# Patient Record
Sex: Female | Born: 1970
Health system: Southern US, Community
[De-identification: ages and names within clinical notes are randomized; demographics above are authoritative.]

## PROBLEM LIST (undated history)

## (undated) DIAGNOSIS — N83209 Unspecified ovarian cyst, unspecified side: Secondary | ICD-10-CM

## (undated) DIAGNOSIS — J45909 Unspecified asthma, uncomplicated: Secondary | ICD-10-CM

## (undated) DIAGNOSIS — Z8669 Personal history of other diseases of the nervous system and sense organs: Secondary | ICD-10-CM

## (undated) DIAGNOSIS — N6459 Other signs and symptoms in breast: Secondary | ICD-10-CM

## (undated) DIAGNOSIS — K5900 Constipation, unspecified: Secondary | ICD-10-CM

## (undated) DIAGNOSIS — IMO0002 Reserved for concepts with insufficient information to code with codable children: Secondary | ICD-10-CM

## (undated) DIAGNOSIS — N809 Endometriosis, unspecified: Secondary | ICD-10-CM

## (undated) DIAGNOSIS — F419 Anxiety disorder, unspecified: Secondary | ICD-10-CM

## (undated) DIAGNOSIS — M810 Age-related osteoporosis without current pathological fracture: Secondary | ICD-10-CM

## (undated) DIAGNOSIS — F429 Obsessive-compulsive disorder, unspecified: Secondary | ICD-10-CM

## (undated) DIAGNOSIS — T7840XA Allergy, unspecified, initial encounter: Secondary | ICD-10-CM

## (undated) HISTORY — DX: Obsessive-compulsive disorder, unspecified: F42.9

## (undated) HISTORY — PX: APPENDECTOMY: SHX54

## (undated) HISTORY — DX: Anxiety disorder, unspecified: F41.9

## (undated) HISTORY — DX: Endometriosis, unspecified: N80.9

## (undated) HISTORY — PX: ABDOMINAL HYSTERECTOMY: SHX81

## (undated) HISTORY — DX: Constipation, unspecified: K59.00

## (undated) HISTORY — PX: LEFT OOPHORECTOMY: SHX1961

## (undated) HISTORY — DX: Allergy, unspecified, initial encounter: T78.40XA

## (undated) HISTORY — PX: BREAST EXCISIONAL BIOPSY: SUR124

## (undated) HISTORY — PX: COSMETIC SURGERY: SHX468

## (undated) HISTORY — PX: TONSILLECTOMY: SUR1361

## (undated) HISTORY — DX: Unspecified ovarian cyst, unspecified side: N83.209

## (undated) HISTORY — PX: RIGHT OOPHORECTOMY: SHX2359

## (undated) HISTORY — DX: Personal history of other diseases of the nervous system and sense organs: Z86.69

## (undated) HISTORY — PX: CHOLECYSTECTOMY: SHX55

---

## 2008-03-21 ENCOUNTER — Emergency Department (HOSPITAL_BASED_OUTPATIENT_CLINIC_OR_DEPARTMENT_OTHER): Admission: EM | Admit: 2008-03-21 | Discharge: 2008-03-21 | Payer: Self-pay | Admitting: Emergency Medicine

## 2008-08-13 ENCOUNTER — Emergency Department (HOSPITAL_BASED_OUTPATIENT_CLINIC_OR_DEPARTMENT_OTHER): Admission: EM | Admit: 2008-08-13 | Discharge: 2008-08-13 | Payer: Self-pay | Admitting: Emergency Medicine

## 2008-08-13 ENCOUNTER — Ambulatory Visit: Payer: Self-pay | Admitting: Diagnostic Radiology

## 2009-02-03 ENCOUNTER — Emergency Department (HOSPITAL_BASED_OUTPATIENT_CLINIC_OR_DEPARTMENT_OTHER): Admission: EM | Admit: 2009-02-03 | Discharge: 2009-02-03 | Payer: Self-pay | Admitting: Emergency Medicine

## 2009-02-03 ENCOUNTER — Ambulatory Visit: Payer: Self-pay | Admitting: Diagnostic Radiology

## 2009-06-21 ENCOUNTER — Emergency Department (HOSPITAL_BASED_OUTPATIENT_CLINIC_OR_DEPARTMENT_OTHER): Admission: EM | Admit: 2009-06-21 | Discharge: 2009-06-21 | Payer: Self-pay | Admitting: Emergency Medicine

## 2009-10-30 ENCOUNTER — Emergency Department (HOSPITAL_BASED_OUTPATIENT_CLINIC_OR_DEPARTMENT_OTHER): Admission: EM | Admit: 2009-10-30 | Discharge: 2009-10-30 | Payer: Self-pay | Admitting: Emergency Medicine

## 2010-04-10 LAB — DIFFERENTIAL
Basophils Absolute: 0.1 10*3/uL (ref 0.0–0.1)
Eosinophils Absolute: 0.6 10*3/uL (ref 0.0–0.7)
Eosinophils Relative: 7 % — ABNORMAL HIGH (ref 0–5)
Lymphocytes Relative: 31 % (ref 12–46)
Monocytes Absolute: 0.7 10*3/uL (ref 0.1–1.0)
Monocytes Relative: 8 % (ref 3–12)

## 2010-04-10 LAB — URINALYSIS, ROUTINE W REFLEX MICROSCOPIC
Bilirubin Urine: NEGATIVE
Glucose, UA: NEGATIVE mg/dL
Leukocytes, UA: NEGATIVE
Nitrite: NEGATIVE
Specific Gravity, Urine: 1.011 (ref 1.005–1.030)
pH: 5.5 (ref 5.0–8.0)

## 2010-04-10 LAB — COMPREHENSIVE METABOLIC PANEL
Albumin: 4 g/dL (ref 3.5–5.2)
BUN: 10 mg/dL (ref 6–23)
CO2: 24 mEq/L (ref 19–32)
Chloride: 109 mEq/L (ref 96–112)
GFR calc Af Amer: >60 mL/min (ref >60)
Glucose, Bld: 84 mg/dL (ref 70–99)
Potassium: 4.9 mEq/L (ref 3.5–5.1)
Total Bilirubin: 1 mg/dL (ref 0.3–1.2)
Total Protein: 7.8 g/dL (ref 6.0–8.3)

## 2010-04-10 LAB — URINE MICROSCOPIC-ADD ON

## 2010-04-10 LAB — CBC
RBC: 4.69 MIL/uL (ref 3.87–5.11)
WBC: 8.4 10*3/uL (ref 4.0–10.5)

## 2010-04-10 LAB — LIPASE, BLOOD: Lipase: 101 U/L (ref 23–300)

## 2010-12-31 ENCOUNTER — Emergency Department (HOSPITAL_BASED_OUTPATIENT_CLINIC_OR_DEPARTMENT_OTHER)
Admission: EM | Admit: 2010-12-31 | Discharge: 2010-12-31 | Disposition: A | Discharge status: Home / Self Care | Payer: BC Managed Care – PPO | Attending: Emergency Medicine | Admitting: Emergency Medicine

## 2010-12-31 ENCOUNTER — Encounter: Payer: Self-pay | Admitting: *Deleted

## 2010-12-31 DIAGNOSIS — H10219 Acute toxic conjunctivitis, unspecified eye: Secondary | ICD-10-CM | POA: Insufficient documentation

## 2010-12-31 DIAGNOSIS — H5710 Ocular pain, unspecified eye: Secondary | ICD-10-CM | POA: Insufficient documentation

## 2010-12-31 MED ORDER — ERYTHROMYCIN 5 MG/GM OP OINT: TOPICAL_OINTMENT | OPHTHALMIC | Status: AC

## 2010-12-31 MED ORDER — FLUORESCEIN SODIUM 1 MG OP STRP
1.0000 | ORAL_STRIP | Freq: Once | OPHTHALMIC | Status: AC
  Administered 2010-12-31: 1 via OPHTHALMIC
  Filled 2010-12-31: qty 1

## 2010-12-31 MED ORDER — IBUPROFEN 800 MG PO TABS
800.0000 mg | ORAL_TABLET | Freq: Once | ORAL | Status: AC
  Administered 2010-12-31: 800 mg via ORAL
  Filled 2010-12-31: qty 1

## 2010-12-31 MED ORDER — TETRACAINE HCL 0.5 % OP SOLN
2.0000 [drp] | Freq: Once | OPHTHALMIC | Status: AC
  Administered 2010-12-31: 2 [drp] via OPHTHALMIC
  Filled 2010-12-31: qty 2

## 2010-12-31 NOTE — ED Provider Notes (Signed)
History    Scribed for Felisa Bonier, MD, the patient was seen in room [facility]. This chart was scribed by Katha Cabal.   CSN: [id number] Arrival date & time: 12/31/2010  6:27 PM   First MD Initiated Contact with Patient 12/31/10 2024      Chief Complaint  Patient presents with  . Eye Pain    (Consider location/radiation/quality/duration/timing/severity/associated sxs/prior treatment) Patient is a 40 y.o. female presenting with eye pain. The history is provided by the patient. No language interpreter was used.  Eye Pain This is a recurrent problem. Episode onset: about a week ago  The problem occurs constantly. The problem has not changed since onset.Associated symptoms include headaches. The symptoms are relieved by nothing.    Patient reports reaction after removing eye makeup.  Patient reports blurred vision and watering eye.    History reviewed. No pertinent past medical history.  Past Surgical History  Procedure Date  . Abdominal hysterectomy     History reviewed. No pertinent family history.  History  Substance Use Topics  . Smoking status: Never Smoker   . Smokeless tobacco: Not on file  . Alcohol Use: Yes    OB History    Grav Para Term Preterm Abortions TAB SAB Ect Mult Living                  Review of Systems  Eyes: Positive for pain, redness and visual disturbance.  Neurological: Positive for headaches.  All other systems reviewed and are negative.    Allergies  Tomato; Banana; Shellfish allergy; Aspirin; and Penicillins  Home Medications   Current Outpatient Rx  Name Route Sig Dispense Refill  . ESCITALOPRAM OXALATE 20 MG PO TABS Oral Take 20 mg by mouth daily.      Marland Kitchen ESTRADIOL 0.5 MG PO TABS Oral Take 0.5 mg by mouth daily.      Marland Kitchen PHENTERMINE HCL 37.5 MG PO CAPS Oral Take 37.5 mg by mouth every morning.      Marland Kitchen PRESCRIPTION MEDICATION Oral Take 1 tablet by mouth daily. Estrogen and Testosterone pill     . EPINEPHRINE 0.3 MG/0.3ML IJ  DEVI Intramuscular Inject 0.3 mg into the muscle as needed. For allergic reaction     . ERYTHROMYCIN 5 MG/GM OP OINT  Place a 1/2 inch ribbon of ointment into the lower eyelid qid for 5 days 1 g 0    BP 118/72  Pulse 84  Temp(Src) 98.4 F (36.9 C) (Oral)  Resp 18  Ht 5\' 8"  (1.727 m)  Wt 170 lb (77.111 kg)  BMI 25.85 kg/m2  SpO2 100%  Physical Exam  Constitutional: She is oriented to person, place, and time. She appears well-developed and well-nourished.  HENT:  Head: Normocephalic and atraumatic.  Eyes: Right conjunctiva is injected. Left conjunctiva is injected.  Fundoscopic exam:      The right eye shows no AV nicking and no papilledema.       The left eye shows no AV nicking and no papilledema.  Slit lamp exam:      The right eye shows no corneal abrasion, no corneal flare, no foreign body, no fluorescein uptake and no anterior chamber bulge.       The left eye shows no corneal abrasion, no corneal flare, no foreign body, no fluorescein uptake and no anterior chamber bulge.       bilateral erythema conjunctivae with injection, No swelling around the eye,   Neurological: She is alert and oriented to person, place, and  time.  Skin: Skin is warm and dry.  Psychiatric: She has a normal mood and affect.    ED Course  Procedures (including critical care time)   DIAGNOSTIC STUDIES: Oxygen Saturation is 100% on room air, normal by my interpretation.     COORDINATION OF CARE: 8:37 PM  Slit lamp exam with fluorescein and tetracaine.      LABS / RADIOLOGY:   Labs Reviewed - No data to display No results found.       MDM   Uncomplicated conjunctivitis.       MEDICATIONS GIVEN IN THE E.D. Scheduled Meds:    . fluorescein  1 strip Both Eyes Once  . ibuprofen  800 mg Oral Once  . tetracaine  2 drop Both Eyes Once   Continuous Infusions:      IMPRESSION: 1. Chemical conjunctivitis      DISCHARGE MEDICATIONS: New Prescriptions   ERYTHROMYCIN  OPHTHALMIC OINTMENT    Place a 1/2 inch ribbon of ointment into the lower eyelid qid for 5 days      I personally performed the services described in this documentation, which was scribed in my presence. The recorded information has been reviewed and considered.             Felisa Bonier, MD 12/31/10 2051

## 2010-12-31 NOTE — ED Notes (Signed)
Pt states she thinks she is having a reaction to eye makeup remover which started about a week ago. Eyes are red, watery. Vision blurred.

## 2011-06-03 ENCOUNTER — Encounter (HOSPITAL_BASED_OUTPATIENT_CLINIC_OR_DEPARTMENT_OTHER): Payer: Self-pay | Admitting: *Deleted

## 2011-06-03 ENCOUNTER — Emergency Department (INDEPENDENT_AMBULATORY_CARE_PROVIDER_SITE_OTHER): Payer: BC Managed Care – PPO

## 2011-06-03 ENCOUNTER — Emergency Department (HOSPITAL_BASED_OUTPATIENT_CLINIC_OR_DEPARTMENT_OTHER)
Admission: EM | Admit: 2011-06-03 | Discharge: 2011-06-03 | Disposition: A | Discharge status: Home / Self Care | Payer: BC Managed Care – PPO | Attending: Emergency Medicine | Admitting: Emergency Medicine

## 2011-06-03 DIAGNOSIS — M549 Dorsalgia, unspecified: Secondary | ICD-10-CM

## 2011-06-03 DIAGNOSIS — M546 Pain in thoracic spine: Secondary | ICD-10-CM | POA: Insufficient documentation

## 2011-06-03 DIAGNOSIS — M545 Low back pain, unspecified: Secondary | ICD-10-CM | POA: Insufficient documentation

## 2011-06-03 DIAGNOSIS — W19XXXA Unspecified fall, initial encounter: Secondary | ICD-10-CM

## 2011-06-03 DIAGNOSIS — IMO0002 Reserved for concepts with insufficient information to code with codable children: Secondary | ICD-10-CM

## 2011-06-03 MED ORDER — HYDROCODONE-ACETAMINOPHEN 5-325 MG PO TABS: 2.0000 | ORAL_TABLET | ORAL | Status: AC | PRN

## 2011-06-03 MED ORDER — HYDROCODONE-ACETAMINOPHEN 5-325 MG PO TABS
2.0000 | ORAL_TABLET | Freq: Once | ORAL | Status: AC
  Administered 2011-06-03: 2 via ORAL
  Filled 2011-06-03: qty 2

## 2011-06-03 MED ORDER — IBUPROFEN 800 MG PO TABS: 800.0000 mg | ORAL_TABLET | Freq: Three times a day (TID) | ORAL | Status: AC

## 2011-06-03 NOTE — ED Notes (Signed)
Fell 1 week ago and injured back. Not getting better.

## 2011-06-03 NOTE — Discharge Instructions (Signed)
Back Pain, Adult Low back pain is very common. About 1 in 5 people have back pain.The cause of low back pain is rarely dangerous. The pain often gets better over time.About half of people with a sudden onset of back pain feel better in just 2 weeks. About 8 in 10 people feel better by 6 weeks.  CAUSES Some common causes of back pain include:  Strain of the muscles or ligaments supporting the spine.   Wear and tear (degeneration) of the spinal discs.   Arthritis.   Direct injury to the back.  DIAGNOSIS Most of the time, the direct cause of low back pain is not known.However, back pain can be treated effectively even when the exact cause of the pain is unknown.Answering your caregiver's questions about your overall health and symptoms is one of the most accurate ways to make sure the cause of your pain is not dangerous. If your caregiver needs more information, he or she may order lab work or imaging tests (X-rays or MRIs).However, even if imaging tests show changes in your back, this usually does not require surgery. HOME CARE INSTRUCTIONS For many people, back pain returns.Since low back pain is rarely dangerous, it is often a condition that people can learn to manageon their own.   Remain active. It is stressful on the back to sit or stand in one place. Do not sit, drive, or stand in one place for more than 30 minutes at a time. Take short walks on level surfaces as soon as pain allows.Try to increase the length of time you walk each day.   Do not stay in bed.Resting more than 1 or 2 days can delay your recovery.   Do not avoid exercise or work.Your body is made to move.It is not dangerous to be active, even though your back may hurt.Your back will likely heal faster if you return to being active before your pain is gone.   Pay attention to your body when you bend and lift. Many people have less discomfortwhen lifting if they bend their knees, keep the load close to their  bodies,and avoid twisting. Often, the most comfortable positions are those that put less stress on your recovering back.   Find a comfortable position to sleep. Use a firm mattress and lie on your side with your knees slightly bent. If you lie on your back, put a pillow under your knees.   Only take over-the-counter or prescription medicines as directed by your caregiver. Over-the-counter medicines to reduce pain and inflammation are often the most helpful.Your caregiver may prescribe muscle relaxant drugs.These medicines help dull your pain so you can more quickly return to your normal activities and healthy exercise.   Put ice on the injured area.   Put ice in a plastic bag.   Place a towel between your skin and the bag.   Leave the ice on for 15 to 20 minutes, 3 to 4 times a day for the first 2 to 3 days. After that, ice and heat may be alternated to reduce pain and spasms.   Ask your caregiver about trying back exercises and gentle massage. This may be of some benefit.   Avoid feeling anxious or stressed.Stress increases muscle tension and can worsen back pain.It is important to recognize when you are anxious or stressed and learn ways to manage it.Exercise is a great option.  SEEK MEDICAL CARE IF:  You have pain that is not relieved with rest or medicine.   You have   pain that does not improve in 1 week.   You have new symptoms.   You are generally not feeling well.  SEEK IMMEDIATE MEDICAL CARE IF:   You have pain that radiates from your back into your legs.   You develop new bowel or bladder control problems.   You have unusual weakness or numbness in your arms or legs.   You develop nausea or vomiting.   You develop abdominal pain.   You feel faint.  Document Released: 01/08/2005 Document Revised: 12/28/2010 Document Reviewed: 05/29/2010 [employer] Patient Information 2012 [employer]. 

## 2011-06-03 NOTE — ED Provider Notes (Signed)
History/physical exam/procedure(s) were performed by non-physician practitioner and as supervising physician I was immediately available for consultation/collaboration. I have reviewed all notes and am in agreement with care and plan.   Hilario Quarry, MD 06/03/11 2251

## 2011-06-03 NOTE — ED Provider Notes (Signed)
History     CSN: [id number]  Arrival date & time 06/03/11  2058   First MD Initiated Contact with Patient 06/03/11 2124      Chief Complaint  Patient presents with  . Back Pain    (Consider location/radiation/quality/duration/timing/severity/associated sxs/prior treatment) Patient is a 41 y.o. female presenting with back pain. The history is provided by the patient. No language interpreter was used.  Back Pain  This is a new problem. The current episode started more than 1 week ago. The problem occurs constantly. The problem has been gradually worsening. The pain is associated with falling. The pain is present in the thoracic spine and lumbar spine. The quality of the pain is described as aching. The pain does not radiate. The pain is at a severity of 8/10. The pain is severe. Stiffness is present all day. She has tried nothing for the symptoms. The treatment provided moderate relief.   Pt complains of low back pain after a fall a week ago.  Pt reports pain has continued. History reviewed. No pertinent past medical history.  Past Surgical History  Procedure Date  . Abdominal hysterectomy     History reviewed. No pertinent family history.  History  Substance Use Topics  . Smoking status: Never Smoker   . Smokeless tobacco: Not on file  . Alcohol Use: Yes    OB History    Grav Para Term Preterm Abortions TAB SAB Ect Mult Living                  Review of Systems  Musculoskeletal: Positive for back pain.  All other systems reviewed and are negative.    Allergies  Tomato; Banana; Shellfish allergy; Aspirin; and Penicillins  Home Medications   Current Outpatient Rx  Name Route Sig Dispense Refill  . ESCITALOPRAM OXALATE 20 MG PO TABS Oral Take 20 mg by mouth daily.      Marland Kitchen ESTRADIOL 0.5 MG PO TABS Oral Take 0.5 mg by mouth daily.      . IBUPROFEN 200 MG PO TABS Oral Take 600 mg by mouth every 6 (six) hours as needed. Patient used this medication for pain.    Marland Kitchen  PHENTERMINE HCL 37.5 MG PO CAPS Oral Take 37.5 mg by mouth every morning.      Marland Kitchen EPINEPHRINE 0.3 MG/0.3ML IJ DEVI Intramuscular Inject 0.3 mg into the muscle as needed. For allergic reaction       BP 116/61  Pulse 91  Temp(Src) 97.6 F (36.4 C) (Oral)  Resp 16  Ht 5\' 8"  (1.727 m)  Wt 170 lb (77.111 kg)  BMI 25.85 kg/m2  SpO2 100%  Physical Exam  Nursing note and vitals reviewed. Constitutional: She is oriented to person, place, and time. She appears well-developed and well-nourished.  HENT:  Head: Normocephalic.  Neck: Normal range of motion.  Cardiovascular: Normal rate.   Pulmonary/Chest: Effort normal.  Abdominal: Soft.  Musculoskeletal: Normal range of motion.  Neurological: She is alert and oriented to person, place, and time. She has normal reflexes.  Skin: Skin is warm.  Psychiatric: She has a normal mood and affect.    ED Course  Procedures (including critical care time)  Labs Reviewed - No data to display Dg Thoracic Spine 2 View  06/03/2011  *RADIOLOGY REPORT*  Clinical Data: Fall.  Back injury.  Left back pain.  THORACIC SPINE - 2 VIEW  Comparison: 08/13/2008  Findings: Thoracic vertebral alignment appears normal.  No thoracic spine fracture or acute subluxation is identified.  No acute thoracic spine findings noted.  IMPRESSION:  No significant abnormality identified.  Original Report Authenticated By: Dellia Cloud, M.D.   Dg Lumbar Spine Complete  06/03/2011  *RADIOLOGY REPORT*  Clinical Data: Fall.  Back pain.  LUMBAR SPINE - COMPLETE 4+ VIEW  Comparison: None.  Findings: Lumbar vertebral alignment appears normal.  No lumbar spine fracture or acute subluxation is identified.  No acute lumbar spine findings noted.  IMPRESSION:  No significant abnormality identified.  Original Report Authenticated By: Dellia Cloud, M.D.     No diagnosis found.    MDM  Pt given vicodin x 2.   Pt advised to follow up with Dr. Pearletha Forge for  evaluation.       Lonia Skinner [place], [place] 06/03/11 2250

## 2012-06-10 ENCOUNTER — Encounter: Payer: Self-pay | Admitting: Family Medicine

## 2012-06-10 ENCOUNTER — Other Ambulatory Visit: Payer: Self-pay | Admitting: Family Medicine

## 2012-06-10 ENCOUNTER — Ambulatory Visit (INDEPENDENT_AMBULATORY_CARE_PROVIDER_SITE_OTHER): Payer: BC Managed Care – PPO | Admitting: Family Medicine

## 2012-06-10 VITALS — BP 93/64 | HR 87 | Ht 67.0 in | Wt 189.0 lb

## 2012-06-10 DIAGNOSIS — F429 Obsessive-compulsive disorder, unspecified: Secondary | ICD-10-CM

## 2012-06-10 DIAGNOSIS — J309 Allergic rhinitis, unspecified: Secondary | ICD-10-CM

## 2012-06-10 DIAGNOSIS — Z1231 Encounter for screening mammogram for malignant neoplasm of breast: Secondary | ICD-10-CM

## 2012-06-10 DIAGNOSIS — N959 Unspecified menopausal and perimenopausal disorder: Secondary | ICD-10-CM

## 2012-06-10 DIAGNOSIS — G43909 Migraine, unspecified, not intractable, without status migrainosus: Secondary | ICD-10-CM

## 2012-06-10 LAB — ESTRADIOL: Estradiol: 375.9 pg/mL

## 2012-06-10 LAB — PROGESTERONE: Progesterone: 0.4 ng/mL

## 2012-06-10 LAB — TESTOSTERONE: Testosterone: 18 ng/dL (ref 10–70)

## 2012-06-10 NOTE — Patient Instructions (Addendum)
1)  Migraine Headaches - Start on Trokendi XR 25 mg for 7 days then increase to 50 mg. You can also try Migrelief.   2)  Anxiety/OCD - Wean yourself down to 20 mg over the next month.    3)  Hormones - Switch from the oral estradiol to the patch - GET MAMMOGRAM; F/U in 3 weeks.    4)  ADHD? - Complete forms (3 people close to you).    Attention Deficit Hyperactivity Disorder Attention deficit hyperactivity disorder (ADHD) is a problem with behavior issues based on the way the brain functions (neurobehavioral disorder). It is a common reason for behavior and academic problems in school. CAUSES  The cause of ADHD is unknown in most cases. It may run in families. It sometimes can be associated with learning disabilities and other behavioral problems. SYMPTOMS  There are 3 types of ADHD. The 3 types and some of the symptoms include:  Inattentive  Gets bored or distracted easily.  Loses or forgets things. Forgets to hand in homework.  Has trouble organizing or completing tasks.  Difficulty staying on task.  An inability to organize daily tasks and school work.  Leaving projects, chores, or homework unfinished.  Trouble paying attention or responding to details. Careless mistakes.  Difficulty following directions. Often seems like is not listening.  Dislikes activities that require sustained attention (like chores or homework).  Hyperactive-impulsive  Feels like it is impossible to sit still or stay in a seat. Fidgeting with hands and feet.  Trouble waiting turn.  Talking too much or out of turn. Interruptive.  Speaks or acts impulsively.  Aggressive, disruptive behavior.  Constantly busy or on the go, noisy.  Combined  Has symptoms of both of the above. Often children with ADHD feel discouraged about themselves and with school. They often perform well below their abilities in school. These symptoms can cause problems in home, school, and in relationships with peers.  As children get older, the excess motor activities can calm down, but the problems with paying attention and staying organized persist. Most children do not outgrow ADHD but with good treatment can learn to cope with the symptoms. DIAGNOSIS  When ADHD is suspected, the diagnosis should be made by professionals trained in ADHD.  Diagnosis will include:  Ruling out other reasons for the child's behavior.  The caregivers will check with the child's school and check their medical records.  They will talk to teachers and parents.  Behavior rating scales for the child will be filled out by those dealing with the child on a daily basis. A diagnosis is made only after all information has been considered. TREATMENT  Treatment usually includes behavioral treatment often along with medicines. It may include stimulant medicines. The stimulant medicines decrease impulsivity and hyperactivity and increase attention. Other medicines used include antidepressants and certain blood pressure medicines. Most experts agree that treatment for ADHD should address all aspects of the child's functioning. Treatment should not be limited to the use of medicines alone. Treatment should include structured classroom management. The parents must receive education to address rewarding good behavior, discipline, and limit-setting. Tutoring or behavioral therapy or both should be available for the child. If untreated, the disorder can have long-term serious effects into adolescence and adulthood. HOME CARE INSTRUCTIONS   Often with ADHD there is a lot of frustration among the family in dealing with the illness. There is often blame and anger that is not warranted. This is a life long illness. There is  no way to prevent ADHD. In many cases, because the problem affects the family as a whole, the entire family may need help. A therapist can help the family find better ways to handle the disruptive behaviors and promote change. If the  child is young, most of the therapist's work is with the parents. Parents will learn techniques for coping with and improving their child's behavior. Sometimes only the child with the ADHD needs counseling. Your caregivers can help you make these decisions.  Children with ADHD may need help in organizing. Some helpful tips include:  Keep routines the same every day from wake-up time to bedtime. Schedule everything. This includes homework and playtime. This should include outdoor and indoor recreation. Keep the schedule on the refrigerator or a bulletin board where it is frequently seen. Mark schedule changes as far in advance as possible.  Have a place for everything and keep everything in its place. This includes clothing, backpacks, and school supplies.  Encourage writing down assignments and bringing home needed books.  Offer your child a well-balanced diet. Breakfast is especially important for school performance. Children should avoid drinks with caffeine including:  Soft drinks.  Coffee.  Tea.  However, some older children (adolescents) may find these drinks helpful in improving their attention.  Children with ADHD need consistent rules that they can understand and follow. If rules are followed, give small rewards. Children with ADHD often receive, and expect, criticism. Look for good behavior and praise it. Set realistic goals. Give clear instructions. Look for activities that can foster success and self-esteem. Make time for pleasant activities with your child. Give lots of affection.  Parents are their children's greatest advocates. Learn as much as possible about ADHD. This helps you become a stronger and better advocate for your child. It also helps you educate your child's teachers and instructors if they feel inadequate in these areas. Parent support groups are often helpful. A national group with local chapters is called CHADD (Children and Adults with Attention Deficit  Hyperactivity Disorder). PROGNOSIS  There is no cure for ADHD. Children with the disorder seldom outgrow it. Many find adaptive ways to accommodate the ADHD as they mature. SEEK MEDICAL CARE IF:  Your child has repeated muscle twitches, cough or speech outbursts.  Your child has sleep problems.  Your child has a marked loss of appetite.  Your child develops depression.  Your child has new or worsening behavioral problems.  Your child develops dizziness.  Your child has a racing heart.  Your child has stomach pains.  Your child develops headaches. Document Released: 12/29/2001 Document Revised: 04/02/2011 Document Reviewed: 08/11/2007 [facility] Patient Information 2013 [place], [place]. Attention Deficit Hyperactivity Disorder Attention deficit hyperactivity disorder (ADHD) is a problem with behavior issues based on the way the brain functions (neurobehavioral disorder). It is a common reason for behavior and academic problems in school. CAUSES  The cause of ADHD is unknown in most cases. It may run in families. It sometimes can be associated with learning disabilities and other behavioral problems. SYMPTOMS  There are 3 types of ADHD. The 3 types and some of the symptoms include:  Inattentive  Gets bored or distracted easily.  Loses or forgets things. Forgets to hand in homework.  Has trouble organizing or completing tasks.  Difficulty staying on task.  An inability to organize daily tasks and school work.  Leaving projects, chores, or homework unfinished.  Trouble paying attention or responding to details. Careless mistakes.  Difficulty following directions. Often seems  like is not listening.  Dislikes activities that require sustained attention (like chores or homework).  Hyperactive-impulsive  Feels like it is impossible to sit still or stay in a seat. Fidgeting with hands and feet.  Trouble waiting turn.  Talking too much or out of turn.  Interruptive.  Speaks or acts impulsively.  Aggressive, disruptive behavior.  Constantly busy or on the go, noisy.  Combined  Has symptoms of both of the above. Often children with ADHD feel discouraged about themselves and with school. They often perform well below their abilities in school. These symptoms can cause problems in home, school, and in relationships with peers. As children get older, the excess motor activities can calm down, but the problems with paying attention and staying organized persist. Most children do not outgrow ADHD but with good treatment can learn to cope with the symptoms. DIAGNOSIS  When ADHD is suspected, the diagnosis should be made by professionals trained in ADHD.  Diagnosis will include:  Ruling out other reasons for the child's behavior.  The caregivers will check with the child's school and check their medical records.  They will talk to teachers and parents.  Behavior rating scales for the child will be filled out by those dealing with the child on a daily basis. A diagnosis is made only after all information has been considered. TREATMENT  Treatment usually includes behavioral treatment often along with medicines. It may include stimulant medicines. The stimulant medicines decrease impulsivity and hyperactivity and increase attention. Other medicines used include antidepressants and certain blood pressure medicines. Most experts agree that treatment for ADHD should address all aspects of the child's functioning. Treatment should not be limited to the use of medicines alone. Treatment should include structured classroom management. The parents must receive education to address rewarding good behavior, discipline, and limit-setting. Tutoring or behavioral therapy or both should be available for the child. If untreated, the disorder can have long-term serious effects into adolescence and adulthood. HOME CARE INSTRUCTIONS   Often with ADHD there is a  lot of frustration among the family in dealing with the illness. There is often blame and anger that is not warranted. This is a life long illness. There is no way to prevent ADHD. In many cases, because the problem affects the family as a whole, the entire family may need help. A therapist can help the family find better ways to handle the disruptive behaviors and promote change. If the child is young, most of the therapist's work is with the parents. Parents will learn techniques for coping with and improving their child's behavior. Sometimes only the child with the ADHD needs counseling. Your caregivers can help you make these decisions.  Children with ADHD may need help in organizing. Some helpful tips include:  Keep routines the same every day from wake-up time to bedtime. Schedule everything. This includes homework and playtime. This should include outdoor and indoor recreation. Keep the schedule on the refrigerator or a bulletin board where it is frequently seen. Mark schedule changes as far in advance as possible.  Have a place for everything and keep everything in its place. This includes clothing, backpacks, and school supplies.  Encourage writing down assignments and bringing home needed books.  Offer your child a well-balanced diet. Breakfast is especially important for school performance. Children should avoid drinks with caffeine including:  Soft drinks.  Coffee.  Tea.  However, some older children (adolescents) may find these drinks helpful in improving their attention.  Children with ADHD  need consistent rules that they can understand and follow. If rules are followed, give small rewards. Children with ADHD often receive, and expect, criticism. Look for good behavior and praise it. Set realistic goals. Give clear instructions. Look for activities that can foster success and self-esteem. Make time for pleasant activities with your child. Give lots of affection.  Parents are their  children's greatest advocates. Learn as much as possible about ADHD. This helps you become a stronger and better advocate for your child. It also helps you educate your child's teachers and instructors if they feel inadequate in these areas. Parent support groups are often helpful. A national group with local chapters is called CHADD (Children and Adults with Attention Deficit Hyperactivity Disorder). PROGNOSIS  There is no cure for ADHD. Children with the disorder seldom outgrow it. Many find adaptive ways to accommodate the ADHD as they mature. SEEK MEDICAL CARE IF:  Your child has repeated muscle twitches, cough or speech outbursts.  Your child has sleep problems.  Your child has a marked loss of appetite.  Your child develops depression.  Your child has new or worsening behavioral problems.  Your child develops dizziness.  Your child has a racing heart.  Your child has stomach pains.  Your child develops headaches. Document Released: 12/29/2001 Document Revised: 04/02/2011 Document Reviewed: 08/11/2007 [facility] Patient Information 2013 [place], [place].

## 2012-06-10 NOTE — Progress Notes (Signed)
Subjective:    Patient ID: Amy Garrett, female    DOB: [birth year], 42 y.o.   MRN: [id number]  HPI  Amy Garrett is here today to establish care with our practice. She previously received her care from Dr. Jen Mow at [facility] who has retired.  She was referred to [place] by the [place] family.  She needs to have several of her medications refilled.     1)  OCD:  She was diagnosed with OCD about two years ago.  She has been on Lexapro ever since.     2)  Allergies:  Her allergies are controlled with Flonase.  She would like to have this refilled.    3)  HRT:  She has taken an oral form of estradiol since her hysterectomy.  It controls her menopausal symptoms.      4)  Weight Loss:  She has taken Phentermine in the past and would like to get a prescription for it.     Review of Systems  Constitutional: Negative for activity change, fatigue and unexpected weight change.  Eyes: Negative.   Respiratory: Negative for shortness of breath.   Cardiovascular: Negative for chest pain, palpitations and leg swelling.  Gastrointestinal: Negative for diarrhea and constipation.  Endocrine: Negative.   Genitourinary: Negative for difficulty urinating.  Musculoskeletal: Negative.   Skin: Negative.   Neurological: Positive for headaches.  Hematological: Negative for adenopathy. Does not bruise/bleed easily.  Psychiatric/Behavioral: Positive for dysphoric mood and decreased concentration. Negative for sleep disturbance. The patient is nervous/anxious.        OCD    Past Medical History  Diagnosis Date  . Allergy   . OCD (obsessive compulsive disorder)   . History of migraine headaches   . Constipation   . Anxiety   . Endometriosis   . Ovarian cyst    Family History  Problem Relation Age of Onset  . Cancer Mother 39    Breast (Bilateral Mastectomy)   . Hypertension Mother   . Hyperlipidemia Mother   . Urolithiasis Mother   . Hematuria Mother   . Diabetes Father   .  Hypertension Father   . Hyperlipidemia Father   . Urolithiasis Sister   . Hematuria Sister   . Diabetes Maternal Grandmother   . Heart disease Maternal Grandmother   . Stroke Maternal Grandmother   . Stroke Paternal Grandfather   . Heart disease Paternal Grandfather    History   Social History Narrative   Marital Status:  Married Veterinary surgeon)   Children:  Sons (2) Janae Sauce; Daughter Trula Ore)     Pets: Cats (3)    Living Situation: Lives with husband and daughter.   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 No distress.  HENT:  Head: Normocephalic.  Eyes: No scleral icterus.  Neck: No thyromegaly present.  Cardiovascular: Normal rate, regular rhythm and normal heart sounds.   Pulmonary/Chest: Effort normal and breath sounds normal.  Abdominal: There is no tenderness.  Musculoskeletal: She exhibits no edema and no tenderness.  Neurological: She is alert.  Skin: Skin is warm and dry.  Psychiatric:  She has a normal mood and affect. Her behavior is normal. Judgment and thought content normal.      Assessment & Plan:   TIME 30 MINUTES:  MORE THAN 50 % OF TIME WAS INVOLVED IN COUNSELING.

## 2012-06-12 LAB — ESTRONE

## 2012-06-12 LAB — DHEA

## 2012-06-22 ENCOUNTER — Encounter: Payer: Self-pay | Admitting: Family Medicine

## 2012-06-22 DIAGNOSIS — N959 Unspecified menopausal and perimenopausal disorder: Secondary | ICD-10-CM | POA: Insufficient documentation

## 2012-06-22 DIAGNOSIS — G43909 Migraine, unspecified, not intractable, without status migrainosus: Secondary | ICD-10-CM | POA: Insufficient documentation

## 2012-06-22 DIAGNOSIS — F429 Obsessive-compulsive disorder, unspecified: Secondary | ICD-10-CM | POA: Insufficient documentation

## 2012-06-22 DIAGNOSIS — J309 Allergic rhinitis, unspecified: Secondary | ICD-10-CM | POA: Insufficient documentation

## 2012-06-22 MED ORDER — ESTRADIOL 0.1 MG/24HR TD PTTW: 1.0000 | MEDICATED_PATCH | TRANSDERMAL | Status: DC

## 2012-06-22 NOTE — Assessment & Plan Note (Addendum)
She is going to try the Minivelle patch.

## 2012-06-22 NOTE — Assessment & Plan Note (Addendum)
We discussed the fact that she is on a very large dosage of Lexapro.  She is going to decrease her dosage.  We discussed the fact that some of her OCD may be more of ADD. She is going to complete an adult ADHD form.

## 2012-06-22 NOTE — Assessment & Plan Note (Addendum)
She was given samples of Tronkendi XL.

## 2012-06-22 NOTE — Progress Notes (Deleted)
  Subjective:    Patient ID: Amy Garrett, female    DOB: 19-Jun-1970, 42 y.o.   MRN: [id number]  HPI      Review of Systems     Objective:   Physical Exam        Assessment & Plan:

## 2012-06-22 NOTE — Assessment & Plan Note (Signed)
Refilled her Flonase.

## 2012-07-03 ENCOUNTER — Ambulatory Visit (INDEPENDENT_AMBULATORY_CARE_PROVIDER_SITE_OTHER): Payer: BC Managed Care – PPO | Admitting: Family Medicine

## 2012-07-03 ENCOUNTER — Encounter: Payer: Self-pay | Admitting: Family Medicine

## 2012-07-03 VITALS — BP 101/68 | HR 85 | Wt 189.0 lb

## 2012-07-03 DIAGNOSIS — Z780 Asymptomatic menopausal state: Secondary | ICD-10-CM

## 2012-07-03 DIAGNOSIS — N951 Menopausal and female climacteric states: Secondary | ICD-10-CM

## 2012-07-03 DIAGNOSIS — R41840 Attention and concentration deficit: Secondary | ICD-10-CM

## 2012-07-03 DIAGNOSIS — N959 Unspecified menopausal and perimenopausal disorder: Secondary | ICD-10-CM

## 2012-07-03 DIAGNOSIS — F429 Obsessive-compulsive disorder, unspecified: Secondary | ICD-10-CM

## 2012-07-03 DIAGNOSIS — G43909 Migraine, unspecified, not intractable, without status migrainosus: Secondary | ICD-10-CM

## 2012-07-03 MED ORDER — TOPIRAMATE ER 50 MG PO CAP24: 1.0000 | ORAL_CAPSULE | Freq: Every day | ORAL | Status: DC

## 2012-07-03 MED ORDER — METHYLPHENIDATE HCL ER (OSM) 36 MG PO TBCR: 36.0000 mg | EXTENDED_RELEASE_TABLET | Freq: Every day | ORAL | Status: DC

## 2012-07-03 MED ORDER — PROGESTERONE MICRONIZED 200 MG PO CAPS: 200.0000 mg | ORAL_CAPSULE | Freq: Every day | ORAL | Status: DC

## 2012-07-03 MED ORDER — ESTRADIOL 0.1 MG/24HR TD PTTW: 1.0000 | MEDICATED_PATCH | TRANSDERMAL | Status: AC

## 2012-07-03 NOTE — Progress Notes (Signed)
Subjective:    Patient ID: Amy Garrett, female    DOB: May 07, 1970, 42 y.o.   MRN: [id number]  HPI  Amy Garrett is here today to go over her most recent lab results, discuss the issues listed below and get medication refills.  She has done well since her last office visit.    1)  HRT:  She has been using samples of Minivelle which has controlled her menopausal symptoms.    2)  Headaches:  She has done well on the samples of Trokendi XL and would like to continue on it.    3)  OCD:  She is doing fine on 10 mg of Lexapro (1/2 of her previous dosage).    Review of Systems  Constitutional: Negative for fatigue and unexpected weight change.  Endocrine: Negative for heat intolerance.  Neurological: Negative for headaches.  Psychiatric/Behavioral: Positive for decreased concentration.    Past Medical History  Diagnosis Date  . OCD (obsessive compulsive disorder)   . History of migraine headaches   . Constipation   . Anxiety   . Endometriosis   . Ovarian cyst   . Allergy     Food & Environmental    Family History  Problem Relation Age of Onset  . Cancer Mother 52    Breast (Bilateral Mastectomy)   . Hypertension Mother   . Hyperlipidemia Mother   . Urolithiasis Mother   . Hematuria Mother   . Diabetes Father   . Hypertension Father   . Hyperlipidemia Father   . Urolithiasis Sister   . Hematuria Sister   . Diabetes Maternal Grandmother   . Heart disease Maternal Grandmother   . Stroke Maternal Grandmother   . Stroke Paternal Grandfather   . Heart disease Paternal Grandfather    History   Social History Narrative   Marital Status:  Married Veterinary surgeon)   Children:  Sons (2) Amy Garrett; Daughter Amy Garrett)     Pets: Cats (3)    Living Situation: Lives with husband and daughter.   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 EPINEPHrine (EPIPEN) 0.3 mg/0.3 mL DEVI Inject 0.3 mg into the muscle as needed. For allergic reaction       . escitalopram (LEXAPRO) 20 MG tablet Take 20 mg by mouth daily.        Marland Kitchen estradiol (ESTRACE) 1 MG tablet Take 1 mg by mouth daily.      Marland Kitchen estradiol (VIVELLE-DOT) 0.1 MG/24HR Place 1 patch (0.1 mg total) onto the skin 2 (two) times a week.  8 patch  12  . fluticasone (FLONASE) 50 MCG/ACT nasal spray Place 2 sprays into the nose daily.      Marland Kitchen ibuprofen (ADVIL,MOTRIN) 200 MG tablet Take 600 mg by mouth every 6 (six) hours as needed. Patient used this medication for pain.      . polyethylene glycol powder (GLYCOLAX/MIRALAX) powder       . rizatriptan (MAXALT-MLT) 10 MG disintegrating tablet        No current facility-administered medications on file prior to visit.  Objective:   Physical Exam  Constitutional: She appears well-nourished. No distress.  HENT:  Head: Normocephalic.  Eyes: No scleral icterus.  Neck: No thyromegaly present.  Cardiovascular: Normal rate, regular rhythm and normal heart sounds.   Pulmonary/Chest: Effort normal and breath sounds normal.  Abdominal: There is no tenderness.  Musculoskeletal: She exhibits no edema and no tenderness.  Neurological: She is alert.  Skin: Skin is warm and dry.  Psychiatric: She has a normal mood and affect. Her behavior is normal. Judgment and thought content normal.          Assessment & Plan:

## 2012-07-03 NOTE — Patient Instructions (Addendum)
1)  Hormones - Estrogen (Minivelle Patch); Progesterone (200 mg); Testosterone (DHEA - [facility] - 5 mg SL)   2)  Migraine Headaches - Stay on the 50 mg of Trokendi (L-Tyrsoine) if you have any trouble.    3)  ADHD - Start on the Concerta 36 mg in the morning.         Hormonal Therapy for Women, Frequently Asked Questions WHAT IS HORMONE THERAPY? Hormone therapy (HT), estrogen and progesterone, provides women with the female hormones that decrease and are lost as women get older. When the hormone estrogen is given alone, it is usually referred to as "ERT." When the hormone progesterone is combined with estrogen, it is generally called "HT." Previously this was known as hormone replacement therapy (HRT). Estrogen is a female hormone that brings about changes in various organs in the body. Progesterone is a female hormone that prepares the uterus for a pregnancy each month. During the change-over to menopause ("perimenopause") these hormone levels start to decrease. This causes many uncomfortable symptoms (see below). When the ovaries stop producing estrogen and progesterone, menstrual periods come to an end. At this point, the woman has experienced menopause. Menopause is complete when a woman misses 12 consecutive menstrual periods. WHAT ARE THE BENEFITS OF HORMONE THERAPY? Hormone therapy has been used to relieve the short-term symptoms of menopause. These include:  Hot flashes.  Depression.  Memory loss.  Correcting irregular menstrual periods.  Night sweats.  Tiredness.  Mood disturbances.  Thinning of scalp hair.  Disturbed sleep.  Vaginal dryness.  Painful intercourse.  Loss of breast tissue. Evidence shows that HT may be helpful in preventing colon cancer and bone loss (osteoporosis). WHAT ARE THE SHORT-TERM RISKS OF HORMONE THERAPY?  Some women report side effects from taking Hormone Therapy, including:  Feeling sick to stomach (nausea).  Fluid  retention.  Swollen breasts.  Acne, when taking HT with progesterone.  Unusual vaginal discharge and bleeding (if the uterus is present).  Headaches.  Some women think HT will make them gain weight. Research now shows this is not true. Some women do gain weight during menopause, but this is because their metabolism slows down as they age. They also may not be increasing their amount or level of physical activity as they get older.  Short-term benefits or side effects should become noticeable within days, weeks, or sometimes months after treatment begins. LONG-TERM RISKS These will not be easily noticeable for each individual woman. There are many factors involved that can contribute to long-term risks and side effects. CANCER There is concern that HT can increase the risk of some cancers, including endometrial cancer (lining of the uterus), breast, and certain (but not all) ovarian or cervix cancers, such as endometriod ovarian cancer.  When estrogen is taken alone, it raises the risk of endometrial cancer, if the uterus is still present. Adding progestin with estrogen (HT) can greatly reduce this risk. Progestin is added to prevent the overgrowth (hyperplasia) of cells in the uterine lining. Women who still have an intact uterus are generally given this combined therapy and should not take estrogen hormone alone without progesterone. HT with estrogen and progestin has been linked to an increased risk of invasive breast cancer. Women who use estrogen plus progestin for four years or longer are more likely to develop breast cancer than women who have not used them for as long. This indicates that the therapy may have a cumulative effect. The decision to take HT should be based on an  overall look at the risk and benefits, and how they fit with your personal and genetic health profile. Conditions that increase the underlying risk of developing breast cancer include:  Family history of breast  cancer.  Early age of the first menstrual period (menarche).  Late age of child bearing.  High fat diet.  Late menopause.  Obesity.  Increased breast density on mammograms.  Certain non-cancerous (benign) breast lesions.  Excessive use of alcohol.  Extensive radiation exposure to the chest. These factors need to be considered when deciding to take HT. If you are currently taking HT and have concerns, talk with your caregiver as soon as possible.  BREAST DENSITY Taking both estrogen and progestin also can affect a woman's breast density. Increased breast density from HT makes it hard for a radiologist to read some special breast x-rays (mammograms). This leads to the need for follow-up mammograms, ultrasound or MRI (magnetic resonance imaging), or taking breast tissue samples that are surgically removed (biopsies). Increased density also is a concern because studies have shown that women age 70 and older, whose mammograms show at least 75 percent dense tissue, are at increased risk for breast cancer. However, it is not known if increased breast density due to HT carries the same risk for breast cancer as having naturally dense breasts. About 25 percent of women who use combined HT have an increase in breast density on their mammograms. This is compared to about 8 percent of women taking estrogen alone. One study showed that stopping HT for about 2 weeks before having a mammogram improved the readability of the mammogram. But further research is needed to confirm the usefulness of this approach. HEART DISEASE In the past, taking HT (estrogen plus progestin) was thought to help protect women against heart disease. However, recent findings show that taking HT poses more risks than benefits. HT could increase a woman's risk for:  Heart disease.  Stroke.  Blood clot in the lung (pulmonary embolism).  Breast cancer.  Blood clots in the legs. Women who have gone through menopause should  not be given HT to prevent heart disease and other chronic conditions.  Women who have gone through menopause and who have heart disease, may have a greater risk of another cardiac event (like heart attack) after starting HT, at least in the short-term. For women who have had strokes, their risk for having another stroke goes up when they start taking HT. Hormones are not recommended for women with heart disease or for women who have had a stroke. If you have gone through menopause, talk with your caregiver about whether hormones are right for you. You can check the [facility] website (http://hoffman.com/) for updates on postmenopausal hormone therapy. OTHER RISKS INCLUDE:  Developing high blood pressure.  Developing gallbladder disease.  Women with a fibroid non-cancerous tumor on the uterus may develop pain, bleeding or increase growth of the fibroid. If you are taking HT, watch for signs of trouble. These include:  Abnormal bleeding.  Breast lumps, bloody discharge or red/painful breasts.  Shortness of breath.  Dizziness.  Abdominal pain.  Severe headaches.  Pain in your calves or chest. Report these signs to your caregiver right away. Also, talk with your caregiver about how often you should have an exam. DOES THE DURATION OF TAKING HT AFFECT BREAST CANCER RISK? The relationship between a woman's risk of developing breast cancer and the length of time that she receives HT is not clear. Some women take HT  for only a few years until the worst of their menopausal symptoms have passed. Others have taken it for 10 years or more. Some researchers believe that there is little or no increased risk of breast cancer associated with short-term use of either HT with estrogen alone or estrogen combined with progestin. But long-term use is linked to an increased risk. Women on HT should continue to do monthly self breast exams and get their mammograms as recommended  by their caregiver. WHY IS MENOPAUSAL HORMONE THERAPY USED IN SPITE OF THE CANCER RISK? The known benefits of HT can improve the quality of life for many women, by reducing uncomfortable symptoms, as mentioned above. There also is evidence that HT helps prevent and treats osteoporosis. There is preliminary evidence that it can help prevent other problems associated with age, including colon cancer. The addition of progestin to the treatment has greatly reduced the risk of uterine cancer. ARE THERE OTHER DRUG THERAPIES KNOWN TO TREAT CONDITIONS RELATED TO MENOPAUSE? A class of antidepressant drugs called Selective Serotonin Reuptake Inhibitors (SSRIs) are effective in treating menopause-related symptoms of depression or mood changes. Vitamin E and Clonidine (drug typically used for high blood pressure) can help reduce hot flashes. To prevent osteoporosis, women who are at high risk for bone loss may be given drugs such as bisphosphonates, alendronate, raloxifene, calcium with vitamin D, calcitonin, and prescription medicines such as fasomax or boneva. Lastly, a class of cholesterol-lowering drugs called HMG-CoA-reductase inhibitors (statins) are proven to be effective for reducing risk of heart disease. They are also being explored to prevent osteoporosis. No alternatives to estrogen exist for prevention of colon cancer  a disease for which early evidence suggests HT may be beneficial. WHO SHOULD NOT USE HT?  HT is often not recommended for women who have any of the following conditions:  Vaginal bleeding of an unknown cause.  Suspected breast cancer or history of breast cancer.  History of endometrial or uterine cancer.  Chronic disease of the liver.  History of heart disease.  History of blood clots in the veins or legs or in the lung (venous thrombosis). This includes women who have had thrombosis or blood clots during pregnancy or when taking birth control pills. Although the risk of blood clots  in women is very low, HT increases the risk.  Severe or uncontrolled high blood pressure.  Anyone who may be pregnant. HOW CAN I SORT THROUGH THE BENEFITS AND RISKS TO MAKE A GOOD DECISION ABOUT WHETHER OR NOT TO USE POSTMENOPAUSAL HORMONE THERAPY? Here are several helpful points, summarizing the findings of the Women's Health [facility]) study:  First, it is important to know that because the study involved healthy women, only a small number of them had either a negative or positive effect from estrogen plus progestin therapy. The percentages describe what would happen to a whole population, not necessarily to any individual woman. Second, remember that percentages are not fate. Whether expressing risks or benefits, a percentage does not mean you will develop a disease. Many factors affect that likelihood, including:  Your lifestyle.  Environmental factors.  Heredity.  Your personal medical history. Realize that most treatments carry risks and benefits. No one can make a treatment choice for you. Talk with your caregiver and decide what is best for your health and quality of life. Begin by finding out your family history and your personal risk profile for:  Heart disease.  Stroke.  Breast cancer.  Osteoporosis.  Colorectal cancer.  Blood clots.  Other medical conditions. Document Released: 10/07/2002 Document Revised: 04/02/2011 Document Reviewed: 11/08/2008 [facility] Patient Information 2014 [place], [place].

## 2012-07-07 ENCOUNTER — Inpatient Hospital Stay (HOSPITAL_BASED_OUTPATIENT_CLINIC_OR_DEPARTMENT_OTHER): Admission: RE | Admit: 2012-07-07 | Payer: BC Managed Care – PPO | Source: Ambulatory Visit

## 2012-07-07 ENCOUNTER — Ambulatory Visit: Payer: BC Managed Care – PPO | Admitting: Family Medicine

## 2012-07-09 ENCOUNTER — Telehealth: Payer: Self-pay

## 2012-07-09 NOTE — Telephone Encounter (Signed)
[employer] called to get information for Concerta prior Authorization.   Approved 06/18/2012- 07/09/2013 Case ID# [id number]

## 2012-07-10 ENCOUNTER — Ambulatory Visit (HOSPITAL_BASED_OUTPATIENT_CLINIC_OR_DEPARTMENT_OTHER)
Admission: RE | Admit: 2012-07-10 | Discharge: 2012-07-10 | Disposition: A | Discharge status: Home / Self Care | Payer: BC Managed Care – PPO | Source: Ambulatory Visit | Attending: Family Medicine | Admitting: Family Medicine

## 2012-07-10 DIAGNOSIS — Z1231 Encounter for screening mammogram for malignant neoplasm of breast: Secondary | ICD-10-CM

## 2012-07-15 ENCOUNTER — Telehealth: Payer: Self-pay

## 2012-07-15 NOTE — Telephone Encounter (Signed)
I called to initiate a prior authorization but it had already been completed at this time for Concerta.

## 2012-08-01 DIAGNOSIS — R41840 Attention and concentration deficit: Secondary | ICD-10-CM | POA: Insufficient documentation

## 2012-08-01 DIAGNOSIS — G43909 Migraine, unspecified, not intractable, without status migrainosus: Secondary | ICD-10-CM | POA: Insufficient documentation

## 2012-08-01 DIAGNOSIS — Z780 Asymptomatic menopausal state: Secondary | ICD-10-CM | POA: Insufficient documentation

## 2012-08-01 NOTE — Assessment & Plan Note (Signed)
She will try a combination of the Minivelle vs Vivelle patch, Prometrium and DHEA.

## 2012-08-01 NOTE — Assessment & Plan Note (Signed)
She will start on Concerta for her concentration.

## 2012-08-01 NOTE — Assessment & Plan Note (Signed)
She will remain on Trokendi XR for migraine prevention.

## 2012-08-01 NOTE — Assessment & Plan Note (Signed)
She is doing fine on 10 mg of Lexapro so she'll remain on this dosage.

## 2012-09-10 ENCOUNTER — Ambulatory Visit (INDEPENDENT_AMBULATORY_CARE_PROVIDER_SITE_OTHER): Payer: BC Managed Care – PPO | Admitting: Family Medicine

## 2012-09-10 DIAGNOSIS — M549 Dorsalgia, unspecified: Secondary | ICD-10-CM

## 2012-09-10 DIAGNOSIS — M25559 Pain in unspecified hip: Secondary | ICD-10-CM

## 2012-09-10 DIAGNOSIS — M542 Cervicalgia: Secondary | ICD-10-CM

## 2012-09-10 DIAGNOSIS — R110 Nausea: Secondary | ICD-10-CM

## 2012-09-10 DIAGNOSIS — M25552 Pain in left hip: Secondary | ICD-10-CM

## 2012-09-10 MED ORDER — IBUPROFEN 800 MG PO TABS: 800.0000 mg | ORAL_TABLET | Freq: Three times a day (TID) | ORAL | Status: AC | PRN

## 2012-09-10 MED ORDER — TRAMADOL HCL 50 MG PO TABS: ORAL_TABLET | ORAL | Status: DC

## 2012-09-10 MED ORDER — METHYLPREDNISOLONE (PAK) 4 MG PO TABS: 4.0000 mg | ORAL_TABLET | Freq: Every day | ORAL | Status: AC

## 2012-09-10 MED ORDER — LIDOCAINE 5 % EX PTCH: 3.0000 | MEDICATED_PATCH | Freq: Two times a day (BID) | CUTANEOUS | Status: AC

## 2012-09-10 MED ORDER — METHYLPREDNISOLONE SODIUM SUCC 125 MG IJ SOLR
125.0000 mg | Freq: Once | INTRAMUSCULAR | Status: AC
  Administered 2012-09-10: 125 mg via INTRAMUSCULAR

## 2012-09-10 MED ORDER — KETOROLAC TROMETHAMINE 60 MG/2ML IM SOLN
60.0000 mg | Freq: Once | INTRAMUSCULAR | Status: AC
  Administered 2012-09-10: 60 mg via INTRAMUSCULAR

## 2012-09-10 MED ORDER — METAXALONE 800 MG PO TABS: 800.0000 mg | ORAL_TABLET | Freq: Three times a day (TID) | ORAL | Status: AC
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
## 2012-09-15 ENCOUNTER — Ambulatory Visit: Payer: BC Managed Care – PPO | Admitting: Family Medicine

## 2012-09-24 ENCOUNTER — Encounter: Payer: Self-pay | Admitting: Family Medicine

## 2012-09-24 DIAGNOSIS — R110 Nausea: Secondary | ICD-10-CM | POA: Insufficient documentation

## 2012-09-24 DIAGNOSIS — M25559 Pain in unspecified hip: Secondary | ICD-10-CM | POA: Insufficient documentation

## 2012-09-24 DIAGNOSIS — M549 Dorsalgia, unspecified: Secondary | ICD-10-CM | POA: Insufficient documentation

## 2012-09-24 DIAGNOSIS — M542 Cervicalgia: Secondary | ICD-10-CM | POA: Insufficient documentation
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
## 2012-09-25 ENCOUNTER — Ambulatory Visit: Payer: BC Managed Care – PPO | Admitting: Family Medicine

## 2012-10-06 ENCOUNTER — Other Ambulatory Visit: Payer: Self-pay | Admitting: Family Medicine

## 2012-11-27 ENCOUNTER — Other Ambulatory Visit: Payer: Self-pay

## 2012-12-09 ENCOUNTER — Emergency Department (HOSPITAL_BASED_OUTPATIENT_CLINIC_OR_DEPARTMENT_OTHER)
Admission: EM | Admit: 2012-12-09 | Discharge: 2012-12-10 | Disposition: A | Discharge status: Home / Self Care | Payer: BC Managed Care – PPO | Attending: Emergency Medicine | Admitting: Emergency Medicine

## 2012-12-09 ENCOUNTER — Encounter (HOSPITAL_BASED_OUTPATIENT_CLINIC_OR_DEPARTMENT_OTHER): Payer: Self-pay | Admitting: Emergency Medicine

## 2012-12-09 DIAGNOSIS — Z880 Allergy status to penicillin: Secondary | ICD-10-CM | POA: Insufficient documentation

## 2012-12-09 DIAGNOSIS — Z791 Long term (current) use of non-steroidal anti-inflammatories (NSAID): Secondary | ICD-10-CM | POA: Insufficient documentation

## 2012-12-09 DIAGNOSIS — F411 Generalized anxiety disorder: Secondary | ICD-10-CM | POA: Insufficient documentation

## 2012-12-09 DIAGNOSIS — Z79899 Other long term (current) drug therapy: Secondary | ICD-10-CM | POA: Insufficient documentation

## 2012-12-09 DIAGNOSIS — H9209 Otalgia, unspecified ear: Secondary | ICD-10-CM | POA: Insufficient documentation

## 2012-12-09 DIAGNOSIS — Z8742 Personal history of other diseases of the female genital tract: Secondary | ICD-10-CM | POA: Insufficient documentation

## 2012-12-09 DIAGNOSIS — G43909 Migraine, unspecified, not intractable, without status migrainosus: Secondary | ICD-10-CM | POA: Insufficient documentation

## 2012-12-09 DIAGNOSIS — K029 Dental caries, unspecified: Secondary | ICD-10-CM | POA: Insufficient documentation

## 2012-12-09 DIAGNOSIS — H9201 Otalgia, right ear: Secondary | ICD-10-CM

## 2012-12-09 DIAGNOSIS — Z8739 Personal history of other diseases of the musculoskeletal system and connective tissue: Secondary | ICD-10-CM | POA: Insufficient documentation

## 2012-12-09 DIAGNOSIS — IMO0002 Reserved for concepts with insufficient information to code with codable children: Secondary | ICD-10-CM | POA: Insufficient documentation

## 2012-12-09 DIAGNOSIS — F429 Obsessive-compulsive disorder, unspecified: Secondary | ICD-10-CM | POA: Insufficient documentation

## 2012-12-09 HISTORY — DX: Reserved for concepts with insufficient information to code with codable children: IMO0002

## 2012-12-09 MED ORDER — KETOROLAC TROMETHAMINE 60 MG/2ML IM SOLN
60.0000 mg | Freq: Once | INTRAMUSCULAR | Status: AC
  Administered 2012-12-10: 60 mg via INTRAMUSCULAR
  Filled 2012-12-09: qty 2

## 2012-12-09 NOTE — ED Notes (Signed)
MD at bedside. 

## 2012-12-09 NOTE — ED Notes (Signed)
Pt reports today with ear pain on right side, now radiating to right side of head.

## 2012-12-09 NOTE — ED Provider Notes (Addendum)
CSN: [id number]     Arrival date & time 12/09/12  2341 History   First MD Initiated Contact with Patient 12/09/12 2348     This chart was scribed for Khaliel Morey Smitty Cords, MD by Manuela Schwartz, ED scribe. This patient was seen in room MH05/MH05 and the patient's care was started at 2348.  Chief Complaint  Patient presents with  . Otalgia   Patient is a 42 y.o. female presenting with ear pain. The history is provided by the patient. No language interpreter was used.  Otalgia Location:  Right Behind ear:  No abnormality Quality:  Aching Severity:  Moderate Onset quality:  Gradual Duration:  1 day Timing:  Constant Progression:  Worsening Chronicity:  New Context: not direct blow, not elevation change, not foreign body in ear, not loud noise and no water in ear   Relieved by:  Nothing Worsened by:  Nothing tried Ineffective treatments:  OTC medications Associated symptoms: no abdominal pain, no congestion, no cough, no diarrhea, no fever, no headaches, no hearing loss, no rash, no rhinorrhea, no sore throat and no vomiting    HPI Comments: Amy Garrett is a 42 y.o. female who presents to the Emergency Department complaining of right ear pain, onset this AM. She denies drainage, recent swimming, fever, trauma/injury, recent falls, sick contacts w/ear infection, previous infection, cu tip trauma, nasal congestion, sore throat. She states pain when moving her jaw or chewing. She states no relief w/ibuprofen.    Past Medical History  Diagnosis Date  . OCD (obsessive compulsive disorder)   . History of migraine headaches   . Constipation   . Anxiety   . Endometriosis   . Ovarian cyst   . Allergy     Food & Environmental   . Bulging disc    Past Surgical History  Procedure Laterality Date  . Appendectomy    . Cholecystectomy    . Cosmetic surgery      Tummy Tuck  . Tonsillectomy    . Left oophorectomy      14 years ago  . Right oophorectomy      13 yrs ago  . Abdominal  hysterectomy      15 years ago excessive bleeding   Family History  Problem Relation Age of Onset  . Cancer Mother 24    Breast (Bilateral Mastectomy)   . Hypertension Mother   . Hyperlipidemia Mother   . Urolithiasis Mother   . Hematuria Mother   . Diabetes Father   . Hypertension Father   . Hyperlipidemia Father   . Urolithiasis Sister   . Hematuria Sister   . Diabetes Maternal Grandmother   . Heart disease Maternal Grandmother   . Stroke Maternal Grandmother   . Stroke Paternal Grandfather   . Heart disease Paternal Grandfather    History  Substance Use Topics  . Smoking status: Never Smoker   . Smokeless tobacco: Not on file  . Alcohol Use: No   OB History   Grav Para Term Preterm Abortions TAB SAB Ect Mult Living                 Review of Systems  Constitutional: Negative for fever and chills.  HENT: Positive for ear pain (right ear pain). Negative for congestion, hearing loss, rhinorrhea, sore throat, trouble swallowing and voice change.   Respiratory: Negative for cough and shortness of breath.   Cardiovascular: Negative for chest pain.  Gastrointestinal: Negative for nausea, vomiting, abdominal pain and diarrhea.  Musculoskeletal: Negative for  back pain.  Skin: Negative for color change and rash.  Neurological: Negative for syncope and headaches.  All other systems reviewed and are negative.   A complete 10 system review of systems was obtained and all systems are negative except as noted in the HPI and PMH.   Allergies  Tomato; Banana; Shellfish allergy; Aspirin; and Penicillins  Home Medications   Current Outpatient Rx  Name  Route  Sig  Dispense  Refill  . cyclobenzaprine (FLEXERIL) 10 MG tablet   Oral   Take 10 mg by mouth 3 (three) times daily as needed for muscle spasms.         Marland Kitchen EPINEPHrine (EPIPEN) 0.3 mg/0.3 mL DEVI   Intramuscular   Inject 0.3 mg into the muscle as needed. For allergic reaction          . escitalopram (LEXAPRO) 20  MG tablet   Oral   Take 20 mg by mouth daily.           Marland Kitchen estradiol (MINIVELLE) 0.1 MG/24HR   Transdermal   Place 1 patch (0.1 mg total) onto the skin 2 (two) times a week.   8 patch   12   . fluticasone (FLONASE) 50 MCG/ACT nasal spray   Nasal   Place 2 sprays into the nose daily.         Marland Kitchen gabapentin (NEURONTIN) 100 MG capsule               . ibuprofen (ADVIL,MOTRIN) 200 MG tablet   Oral   Take 600 mg by mouth every 6 (six) hours as needed. Patient used this medication for pain.         Marland Kitchen ibuprofen (ADVIL,MOTRIN) 800 MG tablet   Oral   Take 1 tablet (800 mg total) by mouth every 8 (eight) hours as needed for pain.   90 tablet   1   . indomethacin (INDOCIN) 50 MG capsule   Oral   Take 50 mg by mouth 2 (two) times daily with a meal.         . lidocaine (LIDODERM) 5 %   Transdermal   Place 3 patches onto the skin every 12 (twelve) hours. Remove & Discard patch within 12 hours or as directed by MD   30 patch   2   . metaxalone (SKELAXIN) 800 MG tablet   Oral   Take 1 tablet (800 mg total) by mouth 3 (three) times daily.   90 tablet   1   . methylphenidate (CONCERTA) 36 MG CR tablet   Oral   Take 1 tablet (36 mg total) by mouth daily.   30 tablet   0     Please fill after 08/02/2012   . methylPREDNIsolone (MEDROL DOSPACK) 4 MG tablet   Oral   Take 1 tablet (4 mg total) by mouth daily. follow package directions   21 tablet   0     6 Day Dose Pak   . ondansetron (ZOFRAN-ODT) 8 MG disintegrating tablet               . polyethylene glycol powder (GLYCOLAX/MIRALAX) powder               . progesterone (PROMETRIUM) 200 MG capsule   Oral   Take 1 capsule (200 mg total) by mouth at bedtime.   30 capsule   11   . rizatriptan (MAXALT-MLT) 10 MG disintegrating tablet               .  Topiramate ER (TROKENDI XR) 50 MG CP24   Oral   Take 1 capsule by mouth daily.   30 capsule   11   . traMADol (ULTRAM) 50 MG tablet      Start with 1  tab at night and increase slowly to 3 tabs if needed for pain   90 tablet   0    Triage Vitals: BP 115/59  Pulse 76  Temp(Src) 97.8 F (36.6 C) (Oral)  Resp 16  Ht 5\' 8"  (1.727 m)  Wt 175 lb (79.379 kg)  BMI 26.61 kg/m2  SpO2 99% Physical Exam  Nursing note and vitals reviewed. Constitutional: She is oriented to person, place, and time. She appears well-developed and well-nourished. No distress.  HENT:  Head: Normocephalic and atraumatic.  Right Ear: Tympanic membrane, external ear and ear canal normal. No lacerations. No swelling. No foreign bodies. No mastoid tenderness. Tympanic membrane is not injected, not scarred, not perforated, not erythematous, not retracted and not bulging. No middle ear effusion. No hemotympanum.  Left Ear: Tympanic membrane, external ear and ear canal normal. No lacerations. No swelling. No foreign bodies. No mastoid tenderness. Tympanic membrane is not injected, not scarred, not perforated, not erythematous, not retracted and not bulging.  No middle ear effusion. No hemotympanum.  Mouth/Throat: Oropharynx is clear and moist. No oropharyngeal exudate, posterior oropharyngeal edema, posterior oropharyngeal erythema or tonsillar abscesses.    Bilateral TMs clear. TMs are ranslucent, non retracted, non bulging, clear, no signs of trauma, non-injected and both canals are clear.  No lesions of the scalp.   Eyes: Conjunctivae and EOM are normal. Right eye exhibits no discharge. Left eye exhibits no discharge.  Pinpoint pupils  Neck: Normal range of motion. No tracheal deviation present.  No cervical lymphadenopathy  Cardiovascular: Normal rate, regular rhythm and normal heart sounds.   No murmur heard. Pulmonary/Chest: Effort normal and breath sounds normal. No stridor. No respiratory distress. She has no wheezes. She has no rales.  Abdominal: Soft. Bowel sounds are normal. She exhibits no distension. There is no tenderness.  Musculoskeletal: Normal range of  motion. She exhibits no edema.  Lymphadenopathy:    She has no cervical adenopathy.  Neurological: She is alert and oriented to person, place, and time.  Skin: Skin is warm and dry.  Psychiatric: She has a normal mood and affect. Thought content normal.    ED Course  Procedures (including critical care time) DIAGNOSTIC STUDIES: Oxygen Saturation is 99% on room air, normal by my interpretation.    COORDINATION OF CARE: At 1150 PM Discussed treatment plan with patient which includes Toradol IM, dental referral, ear drops for pain. Patient agrees.   Labs Review Labs Reviewed - No data to display Imaging Review No results found.  EKG Interpretation   None       MDM  No diagnosis found. Suspect dental pain as source of pain.  Will prescribe pain drops of ear and refer to dentist for xrays and close follow up   I personally performed the services described in this documentation, which was scribed in my presence. The recorded information has been reviewed and is accurate.     Jasmine Awe, MD 12/10/12 0006  Zunairah Devers K Denni France-Rasch, MD 12/10/12 [phone]

## 2012-12-10 MED ORDER — NEOMYCIN-POLYMYXIN-HC 3.5-10000-1 OT SUSP: 4.0000 [drp] | Freq: Four times a day (QID) | OTIC | Status: DC

## 2012-12-10 MED ORDER — CLINDAMYCIN HCL 300 MG PO CAPS: 300.0000 mg | ORAL_CAPSULE | Freq: Four times a day (QID) | ORAL | Status: DC

## 2012-12-22 ENCOUNTER — Encounter (HOSPITAL_BASED_OUTPATIENT_CLINIC_OR_DEPARTMENT_OTHER): Payer: Self-pay | Admitting: Emergency Medicine

## 2012-12-22 ENCOUNTER — Emergency Department (HOSPITAL_BASED_OUTPATIENT_CLINIC_OR_DEPARTMENT_OTHER): Payer: BC Managed Care – PPO

## 2012-12-22 ENCOUNTER — Emergency Department (HOSPITAL_BASED_OUTPATIENT_CLINIC_OR_DEPARTMENT_OTHER)
Admission: EM | Admit: 2012-12-22 | Discharge: 2012-12-23 | Disposition: A | Discharge status: Home / Self Care | Payer: BC Managed Care – PPO | Attending: Emergency Medicine | Admitting: Emergency Medicine

## 2012-12-22 DIAGNOSIS — K5900 Constipation, unspecified: Secondary | ICD-10-CM | POA: Insufficient documentation

## 2012-12-22 DIAGNOSIS — R5381 Other malaise: Secondary | ICD-10-CM | POA: Insufficient documentation

## 2012-12-22 DIAGNOSIS — Z8739 Personal history of other diseases of the musculoskeletal system and connective tissue: Secondary | ICD-10-CM | POA: Insufficient documentation

## 2012-12-22 DIAGNOSIS — R079 Chest pain, unspecified: Secondary | ICD-10-CM

## 2012-12-22 DIAGNOSIS — F429 Obsessive-compulsive disorder, unspecified: Secondary | ICD-10-CM | POA: Insufficient documentation

## 2012-12-22 DIAGNOSIS — G43909 Migraine, unspecified, not intractable, without status migrainosus: Secondary | ICD-10-CM | POA: Insufficient documentation

## 2012-12-22 DIAGNOSIS — J45901 Unspecified asthma with (acute) exacerbation: Secondary | ICD-10-CM | POA: Insufficient documentation

## 2012-12-22 DIAGNOSIS — K219 Gastro-esophageal reflux disease without esophagitis: Secondary | ICD-10-CM | POA: Insufficient documentation

## 2012-12-22 DIAGNOSIS — R002 Palpitations: Secondary | ICD-10-CM | POA: Insufficient documentation

## 2012-12-22 DIAGNOSIS — Z79899 Other long term (current) drug therapy: Secondary | ICD-10-CM | POA: Insufficient documentation

## 2012-12-22 DIAGNOSIS — Z791 Long term (current) use of non-steroidal anti-inflammatories (NSAID): Secondary | ICD-10-CM | POA: Insufficient documentation

## 2012-12-22 DIAGNOSIS — IMO0002 Reserved for concepts with insufficient information to code with codable children: Secondary | ICD-10-CM | POA: Insufficient documentation

## 2012-12-22 DIAGNOSIS — Z880 Allergy status to penicillin: Secondary | ICD-10-CM | POA: Insufficient documentation

## 2012-12-22 DIAGNOSIS — F411 Generalized anxiety disorder: Secondary | ICD-10-CM | POA: Insufficient documentation

## 2012-12-22 DIAGNOSIS — Z792 Long term (current) use of antibiotics: Secondary | ICD-10-CM | POA: Insufficient documentation

## 2012-12-22 DIAGNOSIS — Z8742 Personal history of other diseases of the female genital tract: Secondary | ICD-10-CM | POA: Insufficient documentation

## 2012-12-22 HISTORY — DX: Unspecified asthma, uncomplicated: J45.909

## 2012-12-22 LAB — COMPREHENSIVE METABOLIC PANEL
Albumin: 4.2 g/dL (ref 3.5–5.2)
Alkaline Phosphatase: 61 U/L (ref 39–117)
BUN: 17 mg/dL (ref 6–23)
CO2: 26 mEq/L (ref 19–32)
Chloride: 104 mEq/L (ref 96–112)
Creatinine, Ser: 0.7 mg/dL (ref 0.50–1.10)
GFR calc non Af Amer: >90 mL/min (ref >90)
Glucose, Bld: 103 mg/dL — ABNORMAL HIGH (ref 70–99)
Potassium: 3.5 mEq/L (ref 3.5–5.1)
Total Bilirubin: 0.3 mg/dL (ref 0.3–1.2)

## 2012-12-22 LAB — CBC
HCT: 43.8 % (ref 36.0–46.0)
Hemoglobin: 14.4 g/dL (ref 12.0–15.0)
MCV: 88.7 fL (ref 78.0–100.0)
RBC: 4.94 MIL/uL (ref 3.87–5.11)
WBC: 11.5 10*3/uL — ABNORMAL HIGH (ref 4.0–10.5)

## 2012-12-22 LAB — TROPONIN I: Troponin I: <0.3 ng/mL (ref <0.30)
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
## 2012-12-22 NOTE — ED Notes (Signed)
Pt reports chest pressure and SOB x 2hours- pain does not change with movement

## 2012-12-23 LAB — D-DIMER, QUANTITATIVE: D-Dimer, Quant: 0.41 ug/mL-FEU (ref 0.00–0.48)

## 2012-12-23 LAB — TROPONIN I: Troponin I: <0.3 ng/mL (ref <0.30)

## 2012-12-23 MED ORDER — GI COCKTAIL ~~LOC~~
30.0000 mL | Freq: Once | ORAL | Status: AC
  Administered 2012-12-23: 30 mL via ORAL
  Filled 2012-12-23: qty 30

## 2012-12-23 MED ORDER — ASPIRIN 81 MG PO CHEW
324.0000 mg | CHEWABLE_TABLET | Freq: Once | ORAL | Status: DC
  Filled 2012-12-23: qty 4

## 2012-12-23 MED ORDER — PANTOPRAZOLE SODIUM 20 MG PO TBEC: 20.0000 mg | DELAYED_RELEASE_TABLET | Freq: Every day | ORAL | Status: DC

## 2013-01-26 ENCOUNTER — Encounter: Payer: Self-pay | Admitting: *Deleted

## 2014-12-04 ENCOUNTER — Emergency Department (HOSPITAL_BASED_OUTPATIENT_CLINIC_OR_DEPARTMENT_OTHER): Payer: Worker's Compensation

## 2014-12-04 ENCOUNTER — Emergency Department (HOSPITAL_BASED_OUTPATIENT_CLINIC_OR_DEPARTMENT_OTHER)
Admission: EM | Admit: 2014-12-04 | Discharge: 2014-12-04 | Disposition: A | Discharge status: Home / Self Care | Payer: Worker's Compensation | Attending: Physician Assistant | Admitting: Physician Assistant

## 2014-12-04 ENCOUNTER — Encounter (HOSPITAL_BASED_OUTPATIENT_CLINIC_OR_DEPARTMENT_OTHER): Payer: Self-pay | Admitting: *Deleted

## 2014-12-04 DIAGNOSIS — J45909 Unspecified asthma, uncomplicated: Secondary | ICD-10-CM | POA: Diagnosis not present

## 2014-12-04 DIAGNOSIS — G43909 Migraine, unspecified, not intractable, without status migrainosus: Secondary | ICD-10-CM | POA: Insufficient documentation

## 2014-12-04 DIAGNOSIS — K5900 Constipation, unspecified: Secondary | ICD-10-CM | POA: Diagnosis not present

## 2014-12-04 DIAGNOSIS — Z880 Allergy status to penicillin: Secondary | ICD-10-CM | POA: Diagnosis not present

## 2014-12-04 DIAGNOSIS — W1839XS Other fall on same level, sequela: Secondary | ICD-10-CM | POA: Insufficient documentation

## 2014-12-04 DIAGNOSIS — Z8742 Personal history of other diseases of the female genital tract: Secondary | ICD-10-CM | POA: Insufficient documentation

## 2014-12-04 DIAGNOSIS — F419 Anxiety disorder, unspecified: Secondary | ICD-10-CM | POA: Insufficient documentation

## 2014-12-04 DIAGNOSIS — Z7951 Long term (current) use of inhaled steroids: Secondary | ICD-10-CM | POA: Diagnosis not present

## 2014-12-04 DIAGNOSIS — S79911S Unspecified injury of right hip, sequela: Secondary | ICD-10-CM | POA: Insufficient documentation

## 2014-12-04 DIAGNOSIS — S3992XS Unspecified injury of lower back, sequela: Secondary | ICD-10-CM | POA: Insufficient documentation

## 2014-12-04 DIAGNOSIS — Z79899 Other long term (current) drug therapy: Secondary | ICD-10-CM | POA: Insufficient documentation

## 2014-12-04 DIAGNOSIS — W19XXXS Unspecified fall, sequela: Secondary | ICD-10-CM

## 2014-12-04 MED ORDER — IBUPROFEN 800 MG PO TABS: 800.0000 mg | ORAL_TABLET | Freq: Three times a day (TID) | ORAL | Status: DC

## 2014-12-04 MED ORDER — IBUPROFEN 800 MG PO TABS
800.0000 mg | ORAL_TABLET | Freq: Once | ORAL | Status: AC
  Administered 2014-12-04: 800 mg via ORAL
  Filled 2014-12-04: qty 1

## 2014-12-04 NOTE — ED Provider Notes (Signed)
CSN: [id number]     Arrival date & time 12/04/14  4098 History   First MD Initiated Contact with Patient 12/04/14 [phone]     Chief Complaint  Patient presents with  . 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 She was leaving work when she tripped and fell on her way out. She hit her right hip and right back. Patient reports this been a couple days but she still been having pain. Her husband had her come here today to be evaluated. She's got normal strength, sensation.  She's had no fevers.  Past Medical History  Diagnosis Date  . OCD (obsessive compulsive disorder)   . History of migraine headaches   . Constipation   . Anxiety   . Endometriosis   . Ovarian cyst   . Allergy     Food & Environmental   . Bulging disc   . Asthma    Past Surgical History  Procedure Laterality Date  . Appendectomy    . Cholecystectomy    . Cosmetic surgery      Tummy Tuck  . Tonsillectomy    . Left oophorectomy      14 years ago  . Right oophorectomy      13 yrs ago  . Abdominal hysterectomy      15 years ago excessive bleeding   Family History  Problem Relation Age of Onset  . Cancer Mother 102    Breast (Bilateral Mastectomy)   . Hypertension Mother   . Hyperlipidemia Mother   . Urolithiasis Mother   . Hematuria Mother   . Diabetes Father   . Hypertension Father   . Hyperlipidemia Father   . Urolithiasis Sister   . Hematuria Sister   . Diabetes Maternal Grandmother   . Heart disease Maternal Grandmother   . Stroke Maternal Grandmother   . Stroke Paternal Grandfather   . Heart disease Paternal Grandfather    Social History  Substance Use Topics  . Smoking status: Never Smoker   . Smokeless tobacco: Never Used  . Alcohol Use: 0.5 oz/week    1 drink(s) per week   OB History    No data available     Review of Systems  Constitutional: Negative for  activity change.  Respiratory: Negative for shortness of breath.   Cardiovascular: Negative for chest pain.  Gastrointestinal: Negative for abdominal pain.  Musculoskeletal: Positive for back pain and arthralgias. Negative for gait problem.  Psychiatric/Behavioral: Negative for confusion.      Allergies  Tomato; Banana; Iodine; Shellfish allergy; Aspirin; and Penicillins  Home Medications   Prior to Admission medications   Medication Sig Start Date End Date Taking? Authorizing Provider  ALPRAZolam (XANAX PO) Take by mouth as needed.   Yes Historical Provider, MD  Estrogens Conjugated (PREMARIN PO) Take by mouth.   Yes Historical Provider, MD  clindamycin (CLEOCIN) 300 MG capsule Take 1 capsule (300 mg total) by mouth 4 (four) times daily. X 7 days 12/10/12   April Palumbo, MD  cyclobenzaprine (FLEXERIL) 10 MG tablet Take 10 mg by mouth 3 (three) times daily as needed for muscle spasms.    Historical Provider, MD  DIETHYLPROPION HCL PO Take by mouth.    Historical Provider, MD  EPINEPHrine (EPIPEN) 0.3 mg/0.3 mL DEVI Inject 0.3 mg into the muscle as needed. For allergic reaction     Historical Provider, MD  escitalopram (LEXAPRO) 20 MG tablet Take 20  mg by mouth daily.      Historical Provider, MD  fluticasone (FLONASE) 50 MCG/ACT nasal spray Place 2 sprays into the nose daily.    Historical Provider, MD  gabapentin (NEURONTIN) 100 MG capsule  09/17/12   Historical Provider, MD  ibuprofen (ADVIL,MOTRIN) 200 MG tablet Take 600 mg by mouth every 6 (six) hours as needed. Patient used this medication for pain.    Historical Provider, MD  ibuprofen (ADVIL,MOTRIN) 800 MG tablet Take 1 tablet (800 mg total) by mouth 3 (three) times daily. 12/04/14   Ruthy Forry Lyn Cosby Proby, MD  indomethacin (INDOCIN) 50 MG capsule Take 50 mg by mouth 2 (two) times daily with a meal.    Historical Provider, MD  methylphenidate (CONCERTA) 36 MG CR tablet Take 1 tablet (36 mg total) by mouth daily. 07/03/12 07/03/13   Gillian Scarce, MD  neomycin-polymyxin-hydrocortisone (CORTISPORIN) 3.5-10000-1 otic suspension Place 4 drops into the right ear 4 (four) times daily. X 7 days 12/10/12   April Palumbo, MD  ondansetron (ZOFRAN-ODT) 8 MG disintegrating tablet  09/02/12   Historical Provider, MD  pantoprazole (PROTONIX) 20 MG tablet Take 1 tablet (20 mg total) by mouth daily. 12/23/12   Margarita Grizzle, MD  polyethylene glycol powder (GLYCOLAX/MIRALAX) powder  03/29/12   Historical Provider, MD  progesterone (PROMETRIUM) 200 MG capsule Take 1 capsule (200 mg total) by mouth at bedtime. 07/03/12 07/03/13  Gillian Scarce, MD  rizatriptan (MAXALT-MLT) 10 MG disintegrating tablet  05/14/12   Historical Provider, MD  Topiramate ER (TROKENDI XR) 50 MG CP24 Take 1 capsule by mouth daily. 07/03/12 07/03/13  Gillian Scarce, MD  traMADol (ULTRAM) 50 MG tablet Start with 1 tab at night and increase slowly to 3 tabs if needed for pain 09/10/12   Gillian Scarce, MD   BP 98/65 mmHg  Pulse 88  Temp(Src) 97.7 F (36.5 C) (Oral)  Resp 16  Ht  (1.727 m)  Wt 145 lb (65.772 kg)  BMI 22.05 kg/m2  SpO2 99% Physical Exam  Constitutional: She is oriented to person, place, and time. She appears well-developed and well-nourished.  HENT:  Head: Normocephalic and atraumatic.  Eyes: Conjunctivae are normal. Right eye exhibits no discharge.  Neck: Neck supple.  Cardiovascular: Normal rate.   Pulmonary/Chest: Effort normal.  Abdominal: Soft. She exhibits no distension. There is no tenderness.  Musculoskeletal: Normal range of motion. She exhibits no edema.  Patient has tenderness along the lateral edge of right hip and gluteal muscle on the right. Normal range of motion of hip and knee and ankle. Small abrasion to knee. Normal strength and sensation distally.  Neurological: She is oriented to person, place, and time. No cranial nerve deficit.  Skin: Skin is warm and dry. No rash noted. She is not diaphoretic.  Psychiatric: She has a normal  mood and affect. Her behavior is normal.  Nursing note and vitals reviewed.   ED Course  Procedures (including critical care time) Labs Review Labs Reviewed - No data to display  Imaging Review Dg Lumbar Spine Complete  12/04/2014  CLINICAL DATA:  Pt fell last Monday at work. X-rays ordered for lumbar and RIGHT hip. Lumbar pain, as well as hip pain, is around the posterior RIGHT side close to iliac crest. Pain radiates down to RIGHT leg just proximal to knee. EXAM: LUMBAR SPINE - COMPLETE 4+ VIEW COMPARISON:  06/03/2011 FINDINGS: There is no evidence of lumbar spine fracture. Alignment is normal. Intervertebral disc spaces are maintained. Surgical clips are identified  in the right upper quadrant of the abdomen. There is a large stool burden seen in nondilated loops of colon. IMPRESSION: 1.  No evidence for acute lumbar spine abnormality. 2. Fecal impaction. Electronically Signed   By: Norva PavlovElizabeth  Brown M.D.   On: 12/04/2014 10:58   Dg Hip Unilat With Pelvis 2-3 Views Right  12/04/2014  CLINICAL DATA:  Fall last week with right hip pain. Initial encounter. EXAM: DG HIP (WITH OR WITHOUT PELVIS) 2-3V RIGHT COMPARISON:  None. FINDINGS: There is no evidence of hip fracture or dislocation. The bony pelvis is intact. There is no evidence of arthropathy or other focal bone abnormality. IMPRESSION: Negative. Electronically Signed   By: Irish LackGlenn  Yamagata M.D.   On: 12/04/2014 10:58   I have personally reviewed and evaluated these images and lab results as part of my medical decision-making.   EKG Interpretation None      MDM   Final diagnoses:  Fall, sequela    Patient is a 44 year old female who fell on her way out of work a couple days ago. Patient did not strike her head. Patient fell on her right hip. She still has a small amount of pain in her right gluteal muscle and right lumbar spine muscles. Patient husband had her come get checked out today. Patient has normal sensation distally. Normal  range of motion of all joints on right lower extremity. Do not suspect any fractures. However we will get x-rays to make sure. Patient had Worker's Comp. information to fill out. We will return her to work on Monday.  Patient ambulatory no acute distress.    Sandrea Boer Randall AnLyn Mackie Holness, MD 12/04/14 1108

## 2014-12-04 NOTE — ED Notes (Signed)
Patient fell last Monday while at work.  C/O R lower back/hip and knee pain. Sent by school administration.

## 2016-10-31 IMAGING — DX DG HIP (WITH OR WITHOUT PELVIS) 2-3V*R*
3 series · 3 of 3 positions shown · non-contrast
Comparison: None.

CLINICAL DATA: Fall last week with right hip pain. Initial
encounter.

EXAM:
DG HIP (WITH OR WITHOUT PELVIS) 2-3V RIGHT

[pelvis ap]
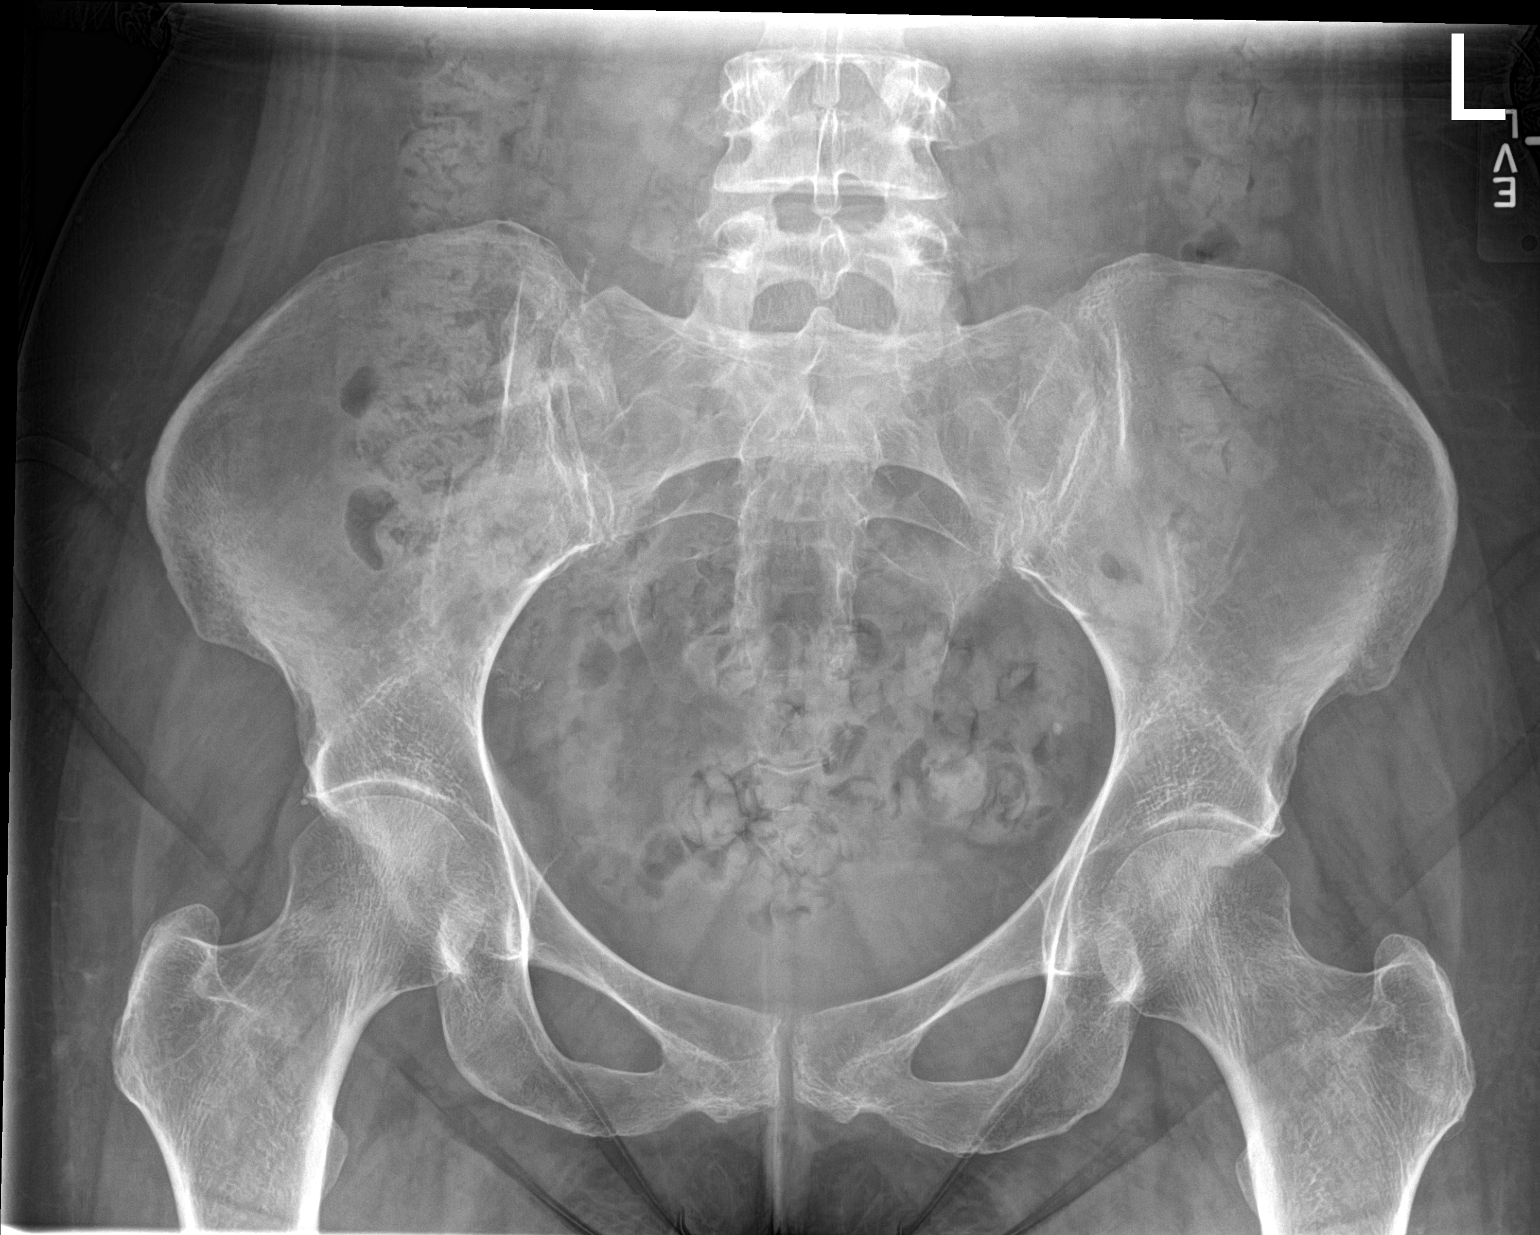

[hip ap]
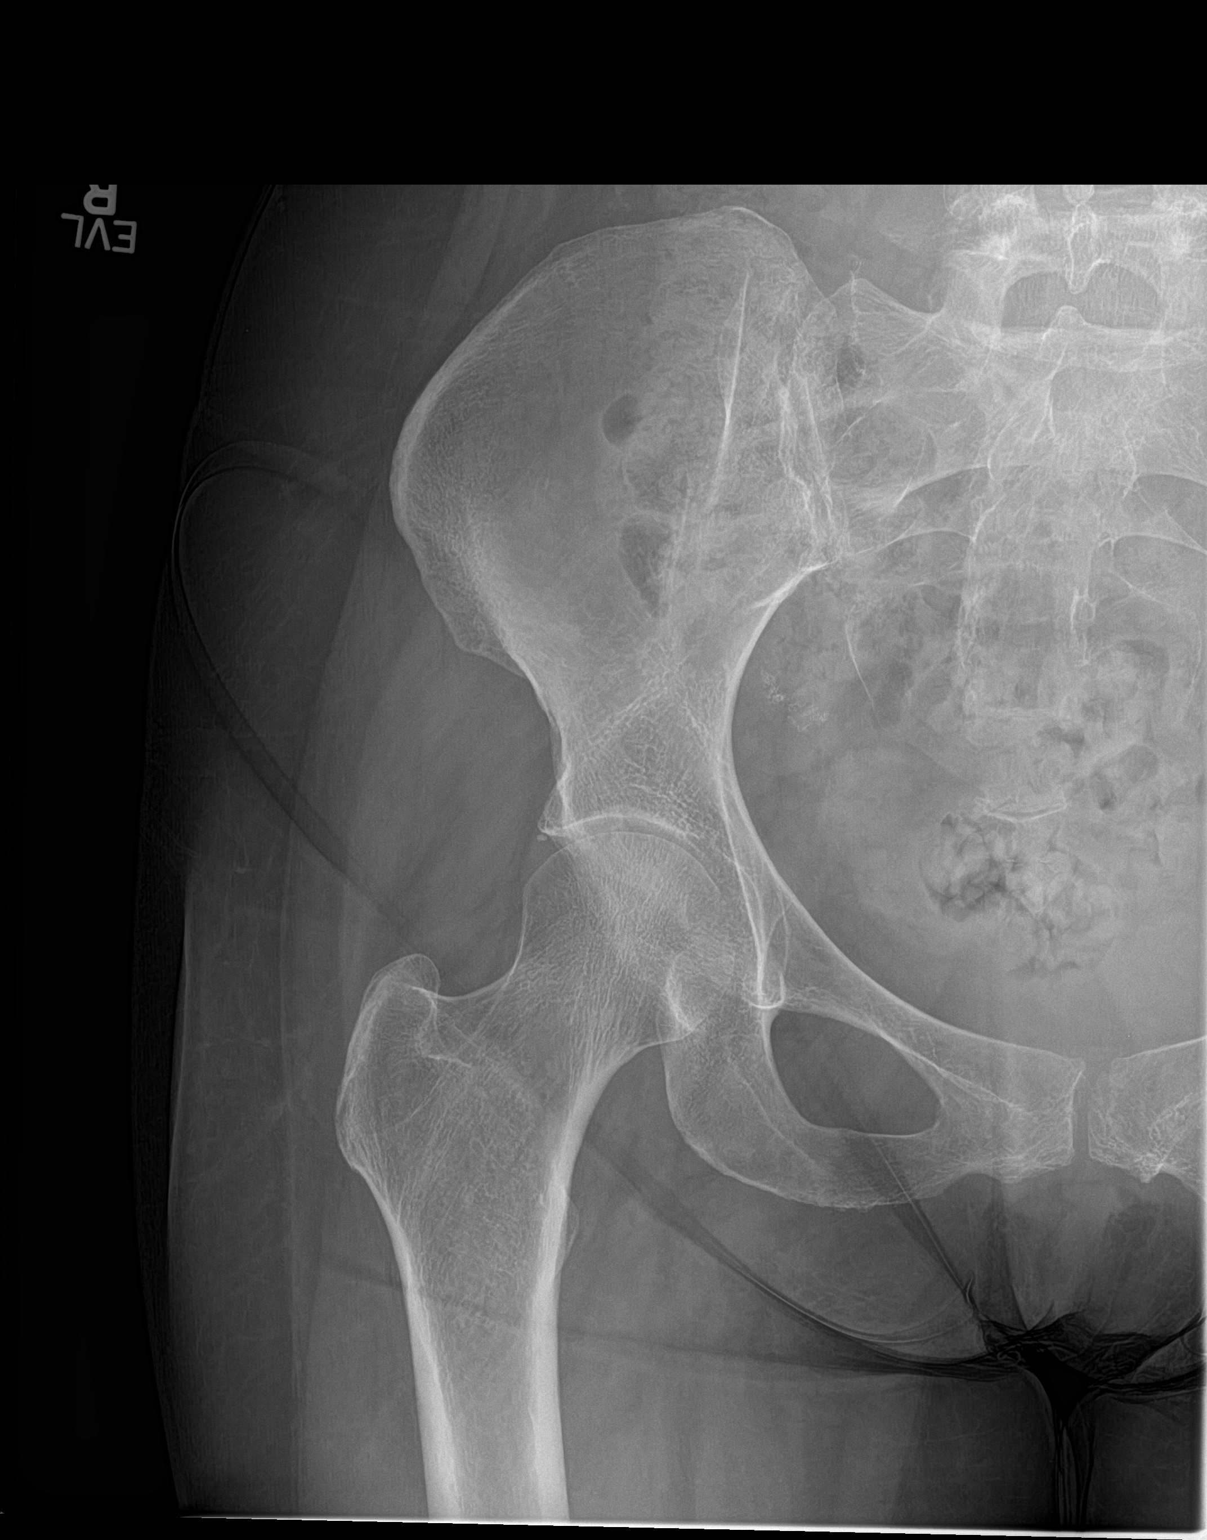

[hip lat]
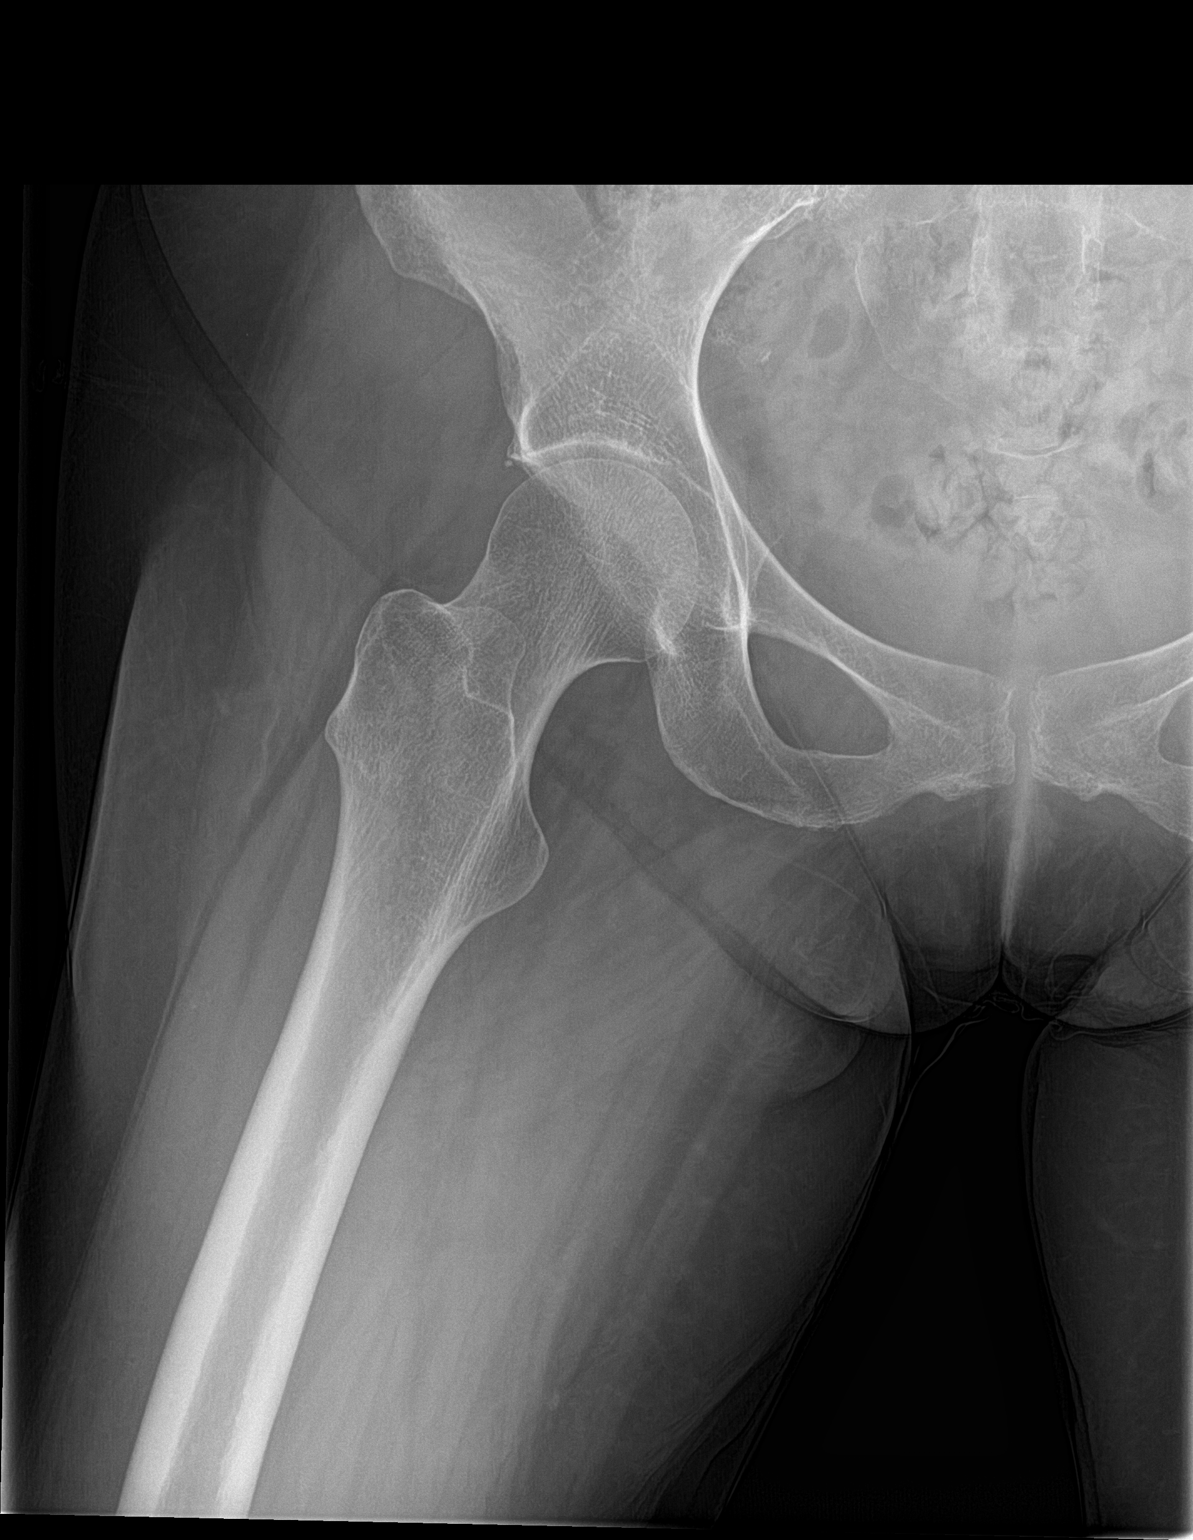

[3 of 3 positions shown; findings below may reference images not displayed]

FINDINGS: There is no evidence of hip fracture or dislocation. The bony pelvis
is intact. There is no evidence of arthropathy or other focal bone
abnormality.
IMPRESSION: Negative.

## 2016-10-31 IMAGING — DX DG LUMBAR SPINE COMPLETE 4+V
5 series · 5 of 5 positions shown · non-contrast
Comparison: 06/03/2011

CLINICAL DATA: Pt fell last [REDACTED] at work. X-rays ordered for
lumbar and RIGHT hip. Lumbar pain, as well as hip pain, is around
the posterior RIGHT side close to iliac crest. Pain radiates down to
RIGHT leg just proximal to knee.

EXAM:
LUMBAR SPINE - COMPLETE 4+ VIEW

[l-spine ap]
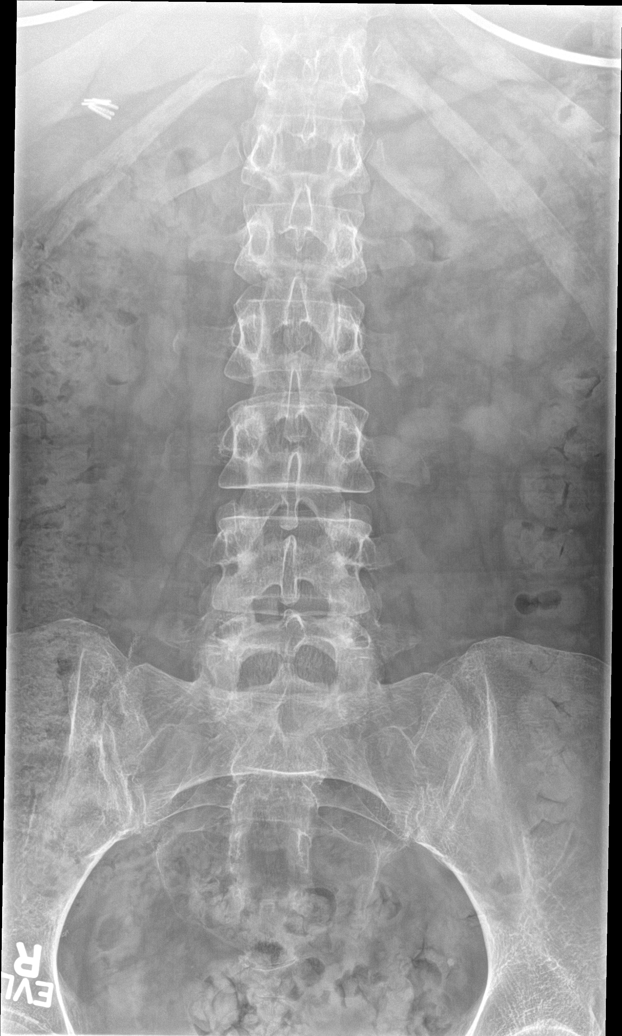

[l-spine obl (1 of 2)]
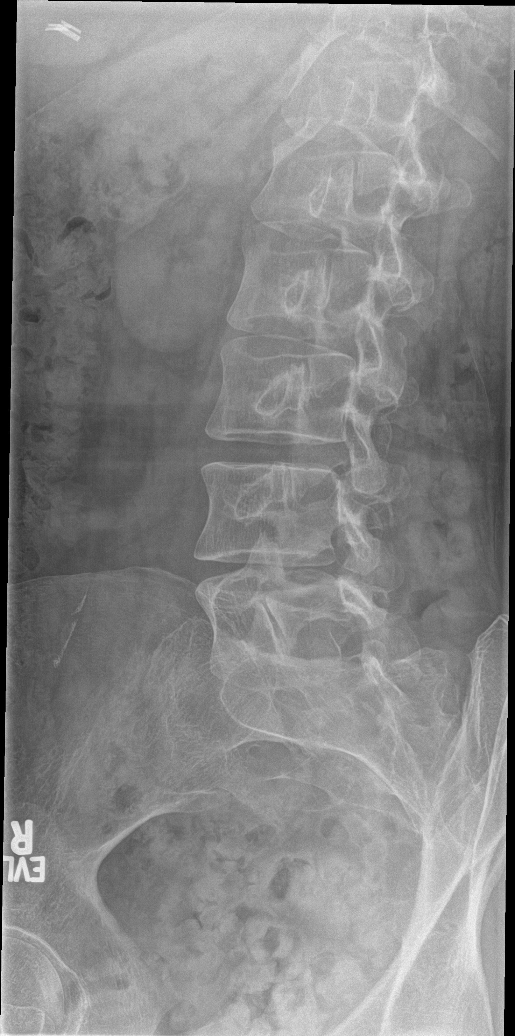

[l-spine obl (2 of 2)]
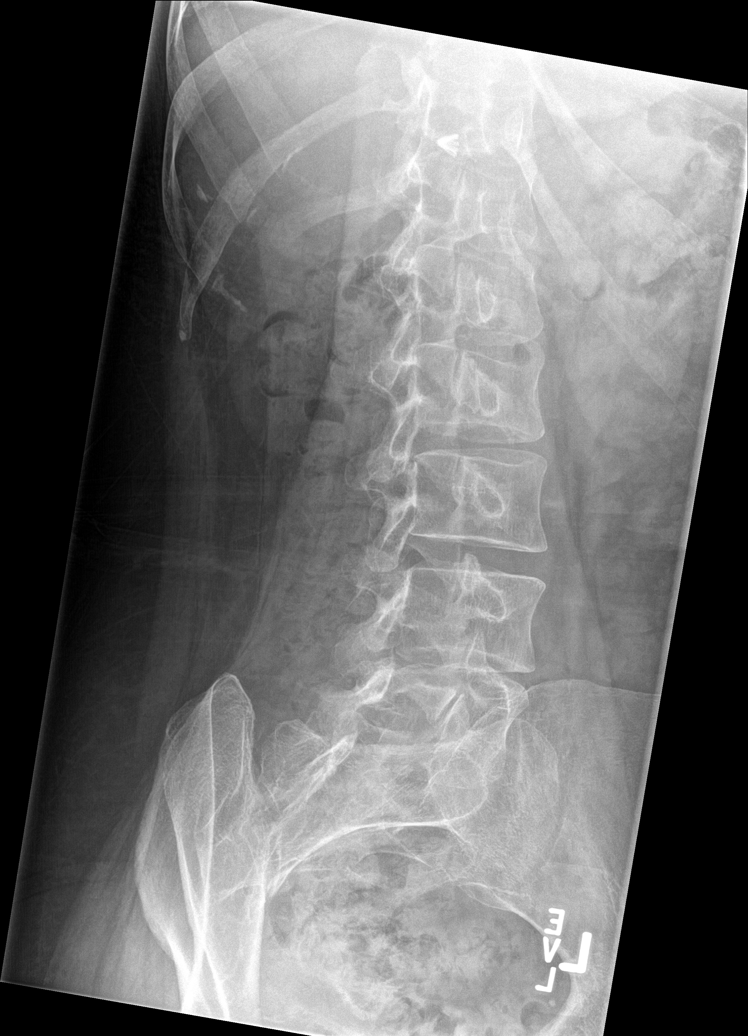

[l-spine lat]
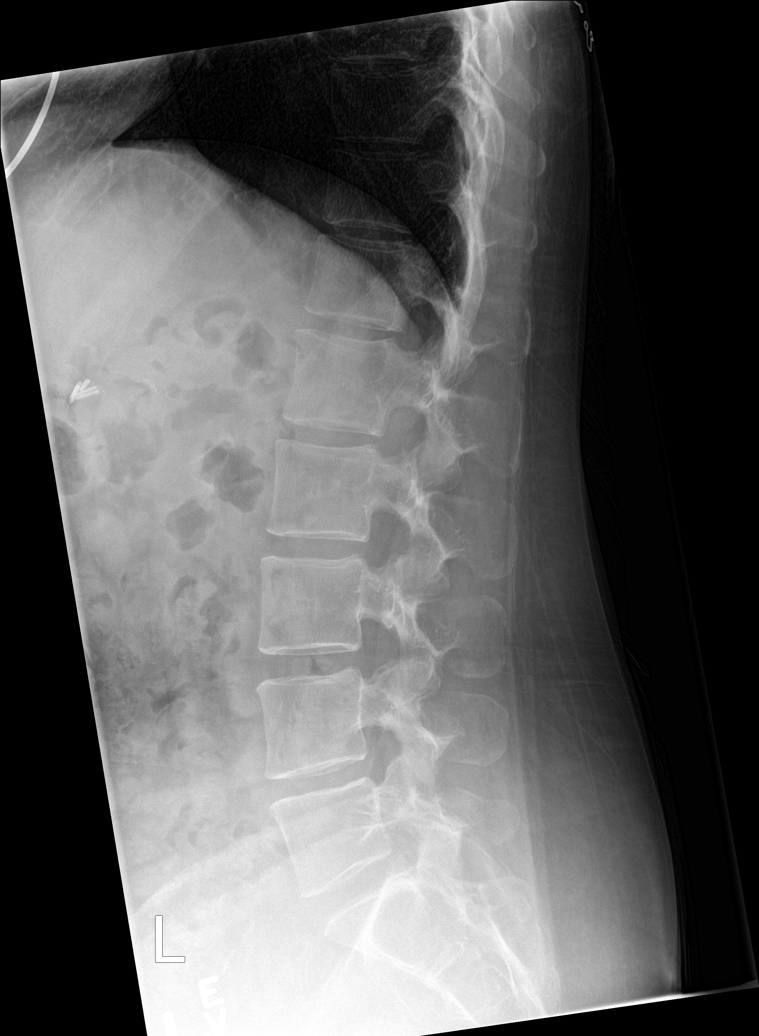

[l-spine spot]
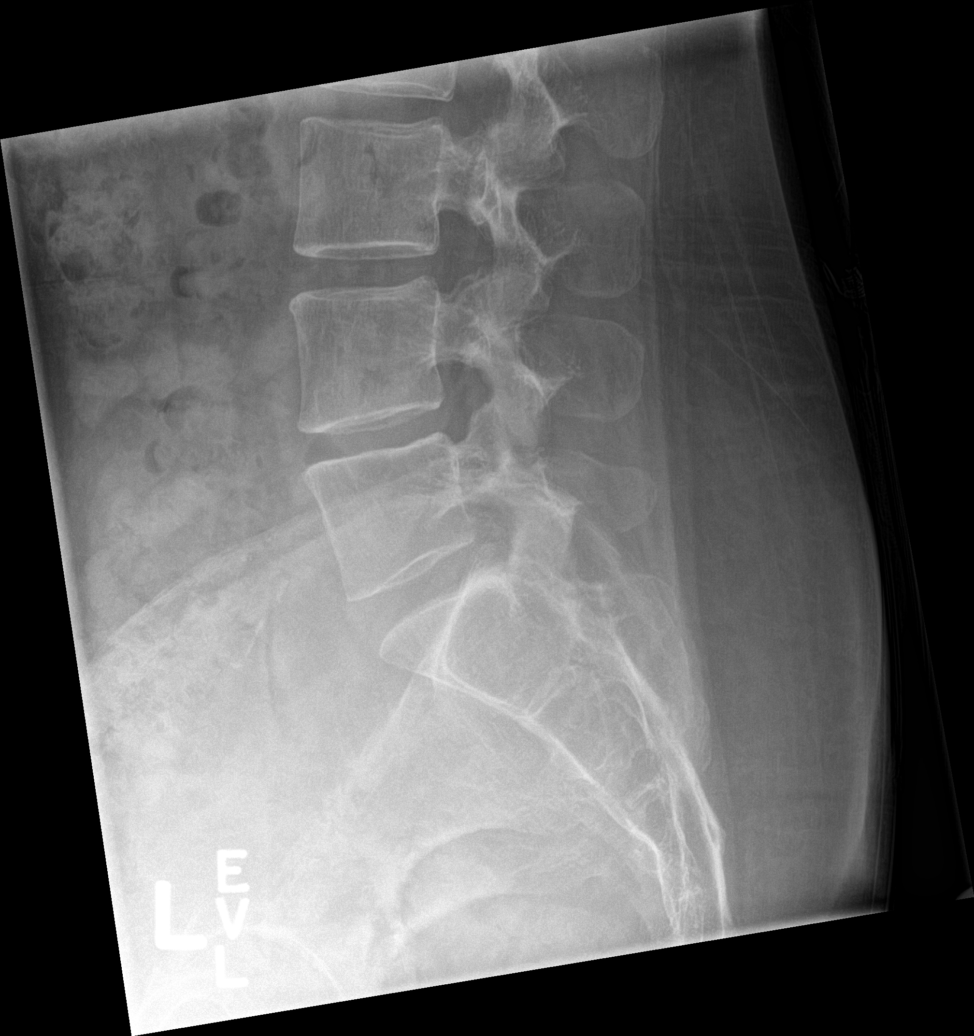

[5 of 5 positions shown; findings below may reference images not displayed]

FINDINGS: There is no evidence of lumbar spine fracture. Alignment is normal.
Intervertebral disc spaces are maintained.

Surgical clips are identified in the right upper quadrant of the
abdomen. There is a large stool burden seen in nondilated loops of
colon.
IMPRESSION: 1.  No evidence for acute lumbar spine abnormality.
2. Fecal impaction.

## 2017-01-18 ENCOUNTER — Other Ambulatory Visit (HOSPITAL_BASED_OUTPATIENT_CLINIC_OR_DEPARTMENT_OTHER): Payer: Self-pay | Admitting: Obstetrics and Gynecology

## 2017-01-18 ENCOUNTER — Other Ambulatory Visit (HOSPITAL_BASED_OUTPATIENT_CLINIC_OR_DEPARTMENT_OTHER): Payer: Self-pay

## 2017-01-18 DIAGNOSIS — Z1231 Encounter for screening mammogram for malignant neoplasm of breast: Secondary | ICD-10-CM

## 2017-01-19 ENCOUNTER — Encounter (HOSPITAL_BASED_OUTPATIENT_CLINIC_OR_DEPARTMENT_OTHER): Payer: Self-pay

## 2017-01-19 ENCOUNTER — Ambulatory Visit (HOSPITAL_BASED_OUTPATIENT_CLINIC_OR_DEPARTMENT_OTHER)
Admission: RE | Admit: 2017-01-19 | Discharge: 2017-01-19 | Disposition: A | Discharge status: Home / Self Care | Payer: BLUE CROSS/BLUE SHIELD | Source: Ambulatory Visit | Attending: Obstetrics and Gynecology | Admitting: Obstetrics and Gynecology

## 2017-01-19 DIAGNOSIS — Z1231 Encounter for screening mammogram for malignant neoplasm of breast: Secondary | ICD-10-CM | POA: Diagnosis present

## 2017-01-19 HISTORY — DX: Other signs and symptoms in breast: N64.59

## 2018-04-09 ENCOUNTER — Emergency Department (HOSPITAL_BASED_OUTPATIENT_CLINIC_OR_DEPARTMENT_OTHER)
Admission: EM | Admit: 2018-04-09 | Discharge: 2018-04-09 | Disposition: A | Discharge status: Home / Self Care | Payer: BLUE CROSS/BLUE SHIELD | Attending: Emergency Medicine | Admitting: Emergency Medicine

## 2018-04-09 ENCOUNTER — Other Ambulatory Visit: Payer: Self-pay

## 2018-04-09 ENCOUNTER — Encounter (HOSPITAL_BASED_OUTPATIENT_CLINIC_OR_DEPARTMENT_OTHER): Payer: Self-pay | Admitting: Emergency Medicine

## 2018-04-09 ENCOUNTER — Emergency Department (HOSPITAL_BASED_OUTPATIENT_CLINIC_OR_DEPARTMENT_OTHER): Payer: BLUE CROSS/BLUE SHIELD

## 2018-04-09 DIAGNOSIS — R072 Precordial pain: Secondary | ICD-10-CM | POA: Diagnosis not present

## 2018-04-09 DIAGNOSIS — R079 Chest pain, unspecified: Secondary | ICD-10-CM | POA: Diagnosis present

## 2018-04-09 DIAGNOSIS — J45909 Unspecified asthma, uncomplicated: Secondary | ICD-10-CM | POA: Insufficient documentation

## 2018-04-09 HISTORY — DX: Age-related osteoporosis without current pathological fracture: M81.0

## 2018-04-09 LAB — CBC WITH DIFFERENTIAL/PLATELET
Abs Immature Granulocytes: 0.01 10*3/uL (ref 0.00–0.07)
BASOS ABS: 0.1 10*3/uL (ref 0.0–0.1)
Basophils Relative: 1 %
Eosinophils Absolute: 0.1 10*3/uL (ref 0.0–0.5)
Eosinophils Relative: 1 %
HCT: 45 % (ref 36.0–46.0)
Hemoglobin: 14 g/dL (ref 12.0–15.0)
Immature Granulocytes: 0 %
Lymphocytes Relative: 51 %
Lymphs Abs: 4.7 10*3/uL — ABNORMAL HIGH (ref 0.7–4.0)
MCH: 28.6 pg (ref 26.0–34.0)
MCHC: 31.1 g/dL (ref 30.0–36.0)
MCV: 91.8 fL (ref 80.0–100.0)
Monocytes Absolute: 0.7 10*3/uL (ref 0.1–1.0)
Monocytes Relative: 7 %
NRBC: 0 % (ref 0.0–0.2)
Neutro Abs: 3.7 10*3/uL (ref 1.7–7.7)
Neutrophils Relative %: 40 %
Platelets: 246 10*3/uL (ref 150–400)
RBC: 4.9 MIL/uL (ref 3.87–5.11)
RDW: 13.1 % (ref 11.5–15.5)
WBC: 9.2 10*3/uL (ref 4.0–10.5)

## 2018-04-09 LAB — BASIC METABOLIC PANEL
Anion gap: 8 (ref 5–15)
BUN: 7 mg/dL (ref 6–20)
CO2: 24 mmol/L (ref 22–32)
Calcium: 8.6 mg/dL — ABNORMAL LOW (ref 8.9–10.3)
Chloride: 109 mmol/L (ref 98–111)
Creatinine, Ser: 0.61 mg/dL (ref 0.44–1.00)
GFR calc Af Amer: >60 mL/min (ref >60)
GFR calc non Af Amer: >60 mL/min (ref >60)
Glucose, Bld: 93 mg/dL (ref 70–99)
Potassium: 3.2 mmol/L — ABNORMAL LOW (ref 3.5–5.1)
Sodium: 141 mmol/L (ref 135–145)

## 2018-04-09 LAB — TROPONIN I: Troponin I: <0.03 ng/mL (ref <0.03)

## 2018-04-09 LAB — D-DIMER, QUANTITATIVE: D-Dimer, Quant: 0.33 ug/mL-FEU (ref 0.00–0.50)

## 2018-04-09 MED ORDER — ALUM & MAG HYDROXIDE-SIMETH 200-200-20 MG/5ML PO SUSP
15.0000 mL | Freq: Once | ORAL | Status: AC
  Administered 2018-04-09: 15 mL via ORAL
  Filled 2018-04-09: qty 30

## 2018-04-09 MED ORDER — PANTOPRAZOLE SODIUM 40 MG PO TBEC: 40.0000 mg | DELAYED_RELEASE_TABLET | Freq: Every day | ORAL | 0 refills | Status: AC

## 2018-04-09 MED FILL — PANTOPRAZOLE SOD DR 40 MG T: 40 | 30 days supply | Qty: 30 | Fill #0

## 2018-04-09 NOTE — ED Provider Notes (Signed)
Emergency Department Provider Note   I have reviewed the triage vital signs and the nursing notes.   HISTORY  Chief Complaint Chest Pain   HPI Amy Garrett is a 48 y.o. female with PMH of asthma presents to the emergency department for evaluation of chest heaviness with dyspnea and mild nausea.  Symptoms began yesterday evening and lasted for approximately 2 hours.  Patient states that they improved and she was able to get to sleep.  She denies any associated diaphoresis.  She notes some mild sinus drainage, mild sore throat, mild ear discomfort.  She is not experience any fevers or cough.  No travel history or recent sick exposure that she is aware of.  This morning, symptoms returned including the chest heaviness and mild dyspnea.  She called her PCP and was referred to the emergency department.  No vomiting or diarrhea.  No belly pain. No leg swelling.  No history of CAD or DVT/PE.  Patient does state that she was taken off of her estrogen medication by her PCP due to a family history of blood clots.  She herself has not been worked up for clotting disorder.   Past Medical History:  Diagnosis Date  . Allergy    Food & Environmental   . Anxiety   . Asthma   . Bulging disc   . Constipation   . Endometriosis   . History of migraine headaches   . Inverted nipple    right  . OCD (obsessive compulsive disorder)   . Osteoporosis   . Ovarian cyst     Patient Active Problem List   Diagnosis Date Noted  . Hip pain, acute 09/24/2012  . Neck pain, acute 09/24/2012  . Back pain, acute 09/24/2012  . Nausea alone 09/24/2012  . Migraine headache 08/01/2012  . Menopause 08/01/2012  . Concentration deficit 08/01/2012  . OCD (obsessive compulsive disorder) 06/22/2012  . Allergic rhinitis 06/22/2012  . Migraine, unspecified, without mention of intractable migraine without mention of status migrainosus 06/22/2012  . Menopausal and perimenopausal disorder 06/22/2012    Past Surgical  History:  Procedure Laterality Date  . ABDOMINAL HYSTERECTOMY     15 years ago excessive bleeding  . APPENDECTOMY    . BREAST EXCISIONAL BIOPSY Right    inverted nipple area was checked  . CHOLECYSTECTOMY    . COSMETIC SURGERY     Tummy Tuck  . LEFT OOPHORECTOMY     14 years ago  . RIGHT OOPHORECTOMY     13 yrs ago  . TONSILLECTOMY      Current Outpatient Rx  . Order #: [id number] Class: Historical Med  . Order #: [id number] Class: Print    Allergies Tomato; Banana; Iodine; Shellfish allergy; Aspirin; and Penicillins  Family History  Problem Relation Age of Onset  . Cancer Mother 32       Breast (Bilateral Mastectomy)   . Hypertension Mother   . Hyperlipidemia Mother   . Urolithiasis Mother   . Hematuria Mother   . Diabetes Father   . Hypertension Father   . Hyperlipidemia Father   . Diabetes Maternal Grandmother   . Heart disease Maternal Grandmother   . Stroke Maternal Grandmother   . Stroke Paternal Grandfather   . Heart disease Paternal Grandfather   . Urolithiasis Sister   . Hematuria Sister     Social History Social History   Tobacco Use  . Smoking status: Never Smoker  . Smokeless tobacco: Never Used  Substance Use Topics  . Alcohol use:  Yes    Alcohol/week: 1.0 standard drinks    Types: 1 Standard drinks or equivalent per week  . Drug use: No    Review of Systems  Constitutional: No fever/chills Eyes: No visual changes. ENT: Positive sore throat and congestion.  Cardiovascular: Positive chest pain. Respiratory: Positive shortness of breath. Gastrointestinal: No abdominal pain.  No nausea, no vomiting.  No diarrhea.  No constipation. Genitourinary: Negative for dysuria. Musculoskeletal: Negative for back pain. Skin: Negative for rash. Neurological: Negative for headaches, focal weakness or numbness.  10-point ROS otherwise negative.  ____________________________________________   PHYSICAL EXAM:  VITAL SIGNS: ED Triage Vitals  Enc  Vitals Group     BP 04/09/18 1512 118/63     Pulse Rate 04/09/18 1512 79     Resp 04/09/18 1512 18     Temp 04/09/18 1512 97.8 F (36.6 C)     Temp Source 04/09/18 1512 Oral     SpO2 04/09/18 1512 100 %     Weight 04/09/18 1512 159 lb 2.8 oz (72.2 kg)     Height 04/09/18 1512 5\' 8"  (1.727 m)     Pain Score 04/09/18 1513 4    Constitutional: Alert and oriented. Well appearing and in no acute distress. Eyes: Conjunctivae are normal. Head: Atraumatic. Nose: No congestion/rhinnorhea. Mouth/Throat: Mucous membranes are moist.  Oropharynx non-erythematous. Neck: No stridor.   Cardiovascular: Normal rate, regular rhythm. Good peripheral circulation. Grossly normal heart sounds.   Respiratory: Normal respiratory effort.  No retractions. Lungs CTAB. Gastrointestinal: Soft and nontender. No distention.  Musculoskeletal: No lower extremity tenderness nor edema. No gross deformities of extremities. Neurologic:  Normal speech and language. No gross focal neurologic deficits are appreciated.  Skin:  Skin is warm, dry and intact. No rash noted.  ____________________________________________   LABS (all labs ordered are listed, but only abnormal results are displayed)  Labs Reviewed  BASIC METABOLIC PANEL - Abnormal; Notable for the following components:      Result Value   Potassium 3.2 (*)    Calcium 8.6 (*)    All other components within normal limits  CBC WITH DIFFERENTIAL/PLATELET - Abnormal; Notable for the following components:   Lymphs Abs 4.7 (*)    All other components within normal limits  TROPONIN I  D-DIMER, QUANTITATIVE (NOT AT [facility])   ____________________________________________  EKG   EKG Interpretation  Date/Time:  Wednesday April 09 2018 15:17:50 EDT Ventricular Rate:  75 PR Interval:    QRS Duration: 90 QT Interval:  386 QTC Calculation: 432 R Axis:   85 Text Interpretation:  Sinus rhythm Baseline wander in lead(s) I II III aVL aVF V2 V4 V5 No STEMI.   Confirmed by Alona Bene [phone]) on 04/09/2018 3:23:48 PM       ____________________________________________  RADIOLOGY  Dg Chest 2 View  Result Date: 04/09/2018 CLINICAL DATA:  Chest heaviness and shortness of breath beginning last night. EXAM: CHEST - 2 VIEW COMPARISON:  12/22/2012 FINDINGS: Artifact overlies the chest. Heart size is normal. Mediastinal shadows are normal. Lungs are clear. The vascularity is normal. No effusions. IMPRESSION: No active cardiopulmonary disease. Electronically Signed   By: Paulina Fusi M.D.   On: 04/09/2018 15:57    ____________________________________________   PROCEDURES  Procedure(s) performed:   Procedures  None  ____________________________________________   INITIAL IMPRESSION / ASSESSMENT AND PLAN / ED COURSE  Pertinent labs & imaging results that were available during my care of the patient were reviewed by me and considered in my medical decision making (see  chart for details).  Patient presents to the emergency department with chest heaviness starting last night with some shortness of breath symptoms.  No fevers, chills, infection symptoms.  No travel history or COVID-19 exposure.  Patient is well-appearing.  No ischemic changes on EKG.  Vital signs are normal including heart rate and oxygen saturation.  Patient does mention family history of clotting.  Given her shortness of breath and chest tightness I do plan for d-dimer in addition to troponin, screening labs, chest x-ray. Patient with low MACE risk by HEART score (2).   04:10 PM Lab work reviewed along with chest x-ray.  Troponin and d-dimer are negative.  Chest x-ray without acute findings.  Suspect possible reflux but encouraged the patient to return to the emergency department immediately if her chest pain symptoms acutely worsen.  She will follow with her PCP by phone.  I have also given the contact information for cardiology to make a follow-up appointment as an outpatient.   Patient will be given Maalox prior to discharge and sent home with prescription for Protonix. Discussed ED return precautions in detail.  _________________________________________  FINAL CLINICAL IMPRESSION(S) / ED DIAGNOSES  Final diagnoses:  Precordial chest pain     MEDICATIONS GIVEN DURING THIS VISIT:  Medications  alum & mag hydroxide-simeth (MAALOX/MYLANTA) 200-200-20 MG/5ML suspension 15 mL (has no administration in time range)     NEW OUTPATIENT MEDICATIONS STARTED DURING THIS VISIT:  New Prescriptions   PANTOPRAZOLE (PROTONIX) 40 MG TABLET    Take 1 tablet (40 mg total) by mouth daily for 30 days.    Note:  This document was prepared using Dragon voice recognition software and may include unintentional dictation errors.  Alona Bene, MD Emergency Medicine    Long, Arlyss Repress, MD 04/09/18 [phone]

## 2018-04-09 NOTE — Discharge Instructions (Signed)

## 2018-04-09 NOTE — ED Notes (Signed)
ED Provider at bedside. 

## 2018-04-09 NOTE — ED Notes (Signed)
Patient transported to X-ray 

## 2018-04-09 NOTE — ED Triage Notes (Signed)
Chest heaviness last night that felt like "something sitting on my chest" and SOB.  It eased up and she went to bed. The feeling was back this morning.  She called her pmd and he sent to ED.

## 2019-06-24 ENCOUNTER — Emergency Department
Admission: EM | Admit: 2019-06-24 | Discharge: 2019-06-24 | Disposition: A | Discharge status: Home / Self Care | Payer: BLUE CROSS/BLUE SHIELD | Source: Home / Self Care

## 2019-06-24 ENCOUNTER — Encounter: Payer: Self-pay | Admitting: Emergency Medicine

## 2019-06-24 ENCOUNTER — Emergency Department (INDEPENDENT_AMBULATORY_CARE_PROVIDER_SITE_OTHER): Payer: BC Managed Care – PPO

## 2019-06-24 ENCOUNTER — Other Ambulatory Visit: Payer: Self-pay

## 2019-06-24 DIAGNOSIS — R05 Cough: Secondary | ICD-10-CM

## 2019-06-24 DIAGNOSIS — R000 Tachycardia, unspecified: Secondary | ICD-10-CM

## 2019-06-24 DIAGNOSIS — R0602 Shortness of breath: Secondary | ICD-10-CM | POA: Diagnosis not present

## 2019-06-24 DIAGNOSIS — R059 Cough, unspecified: Secondary | ICD-10-CM

## 2019-06-24 NOTE — Discharge Instructions (Signed)
  Your chest x-ray was normal this evening. It is concerning you are having shortness of breath and an elevated heart rate. If you do have Covid, you are at an increased risk of blood clots.  You have declined EMS transport but it is still advised you drive yourself directly to [facility] for further evaluation and treatment of your shortness of breath.

## 2019-06-24 NOTE — ED Triage Notes (Addendum)
Cough since Sat  teledoc visit on Monday for SOB -dx w/ bronchitis- given z-pak Continues w/ cough & SOB Grandson dx w/ COVID 05/29/19 Pt had Covid test done today at [facility] No COVID vaccine

## 2019-06-24 NOTE — ED Notes (Signed)
Triage complete at 1842

## 2019-06-24 NOTE — ED Provider Notes (Signed)
[facility]    CSN: [id number] Arrival date & time: 06/24/19  1817      History   Chief Complaint Chief Complaint  Patient presents with  . Cough  . Shortness of Breath    HPI Amy Garrett is a 49 y.o. female.   HPI  Amy Garrett is a 49 y.o. female presenting to UC with c/o cough, congestion and worsening SOB for 5 days. She completed a Teledoc visit Monday, 06/22/19, dx with bronchitis, given a Z-pak.  She has not had any relief.  Yesterday, she developed a subjective fever.  She went to [facility] today for a covid test, it is still pending. She notes her 64yo grandson, who lives with her part-time, tested positive for Covid on 05/29/19. Pt is not vaccinated, she had planned to get vaccinated later this month.  Denies hx of asthma. She has not used an inhaler and was Not prescribed prednisone for her SOB on Monday.   Pt notes she does have a family hx of a clotting disorder but pt has never had a blood clot.     Past Medical History:  Diagnosis Date  . Allergy    Food & Environmental   . Anxiety   . Asthma   . Bulging disc   . Constipation   . Endometriosis   . History of migraine headaches   . Inverted nipple    right  . OCD (obsessive compulsive disorder)   . Osteoporosis   . Ovarian cyst     Patient Active Problem List   Diagnosis Date Noted  . Hip pain, acute 09/24/2012  . Neck pain, acute 09/24/2012  . Back pain, acute 09/24/2012  . Nausea alone 09/24/2012  . Migraine headache 08/01/2012  . Menopause 08/01/2012  . Concentration deficit 08/01/2012  . OCD (obsessive compulsive disorder) 06/22/2012  . Allergic rhinitis 06/22/2012  . Migraine, unspecified, without mention of intractable migraine without mention of status migrainosus 06/22/2012  . Menopausal and perimenopausal disorder 06/22/2012    Past Surgical History:  Procedure Laterality Date  . ABDOMINAL HYSTERECTOMY     15 years ago excessive bleeding  . APPENDECTOMY    . BREAST EXCISIONAL  BIOPSY Right    inverted nipple area was checked  . CHOLECYSTECTOMY    . COSMETIC SURGERY     Tummy Tuck  . LEFT OOPHORECTOMY     14 years ago  . RIGHT OOPHORECTOMY     13 yrs ago  . TONSILLECTOMY      OB History   No obstetric history on file.      Home Medications    Prior to Admission medications   Medication Sig Start Date End Date Taking? Authorizing Provider  azithromycin (ZITHROMAX) 250 MG tablet Take 250 mg by mouth daily. 06/22/19  Yes [provider]  benzonatate (TESSALON) 200 MG capsule  06/22/19  Yes [provider]  calcium-vitamin D (OSCAL WITH D) 500-200 MG-UNIT TABS tablet Take by mouth.   Yes [provider]  ergocalciferol (VITAMIN D2) 1.25 MG (50000 UT) capsule Take by mouth. 07/07/18  Yes [provider]  denosumab (PROLIA) 60 MG/ML SOSY injection Inject into the skin.    [provider]  etodolac (LODINE) 400 MG tablet Take 400 mg by mouth 2 (two) times daily. 03/25/19   [provider]  pantoprazole (PROTONIX) 40 MG tablet Take 1 tablet (40 mg total) by mouth daily for 30 days. 04/09/18 05/09/18  Long, Wonda Old[facility], MD    Family History  Family History  Problem Relation Age of Onset  . Cancer Mother 66       Breast (Bilateral Mastectomy)   . Hypertension Mother   . Hyperlipidemia Mother   . Urolithiasis Mother   . Hematuria Mother   . Diabetes Father   . Hypertension Father   . Hyperlipidemia Father   . Diabetes Maternal Grandmother   . Heart disease Maternal Grandmother   . Stroke Maternal Grandmother   . Stroke Paternal Grandfather   . Heart disease Paternal Grandfather   . Urolithiasis Sister   . Hematuria Sister     Social History Social History   Tobacco Use  . Smoking status: Never Smoker  . Smokeless tobacco: Never Used  Substance Use Topics  . Alcohol use: Yes    Alcohol/week: 1.0 standard drinks    Types: 1 Standard drinks or equivalent per week  . Drug use: No     Allergies     Iodinated diagnostic agents, Sulfa antibiotics, Tomato, Banana, Iodine, Shellfish allergy, Aspirin, and Penicillins   Review of Systems Review of Systems  Constitutional: Positive for chills and fever (subjective).  HENT: Positive for congestion. Negative for ear pain, sore throat, trouble swallowing and voice change.   Respiratory: Positive for cough, chest tightness and shortness of breath.   Cardiovascular: Negative for chest pain and palpitations.  Gastrointestinal: Negative for abdominal pain, diarrhea, nausea and vomiting.  Musculoskeletal: Positive for arthralgias, back pain and myalgias.  Skin: Negative for rash.  Neurological: Positive for headaches. Negative for dizziness and light-headedness.  All other systems reviewed and are negative.    Physical Exam Triage Vital Signs ED Triage Vitals  Enc Vitals Group     BP 06/24/19 1832 113/79     Pulse Rate 06/24/19 1832 (!) 114     Resp 06/24/19 1832 20     Temp 06/24/19 1832 99.1 F (37.3 C)     Temp Source 06/24/19 1832 Oral     SpO2 06/24/19 1832 97 %     Weight --      Height --      Head Circumference --      Peak Flow --      Pain Score 06/24/19 1835 5     Pain Loc --      Pain Edu? --      Excl. in GC? --    No data found.  Updated Vital Signs BP 113/79 (BP Location: Right Arm)   Pulse (!) 114   Temp 99.1 F (37.3 C) (Oral)   Resp 20   SpO2 97%   Visual Acuity Right Eye Distance:   Left Eye Distance:   Bilateral Distance:    Right Eye Near:   Left Eye Near:    Bilateral Near:     Physical Exam Vitals and nursing note reviewed.  Constitutional:      General: She is not in acute distress.    Appearance: She is well-developed. She is not ill-appearing, toxic-appearing or diaphoretic.  HENT:     Head: Normocephalic and atraumatic.     Right Ear: Tympanic membrane and ear canal normal.     Left Ear: Tympanic membrane and ear canal normal.     Nose: Nose normal.     Right Sinus: No maxillary  sinus tenderness or frontal sinus tenderness.     Left Sinus: No maxillary sinus tenderness or frontal sinus tenderness.     Mouth/Throat:     Lips: Pink.     Mouth: Mucous  membranes are moist.     Pharynx: Oropharynx is clear. Uvula midline.  Cardiovascular:     Rate and Rhythm: Regular rhythm. Tachycardia present.  Pulmonary:     Effort: Pulmonary effort is normal.     Breath sounds: Normal breath sounds. No decreased breath sounds, wheezing, rhonchi or rales.  Musculoskeletal:        General: Normal range of motion.     Cervical back: Normal range of motion.  Skin:    General: Skin is warm and dry.  Neurological:     Mental Status: She is alert and oriented to person, place, and time.  Psychiatric:        Behavior: Behavior normal.      UC Treatments / Results  Labs (all labs ordered are listed, but only abnormal results are displayed) Labs Reviewed - No data to display  EKG   Radiology DG Chest 2 View  Result Date: 06/24/2019 CLINICAL DATA:  Cough, shortness of breath EXAM: CHEST - 2 VIEW COMPARISON:  06/17/2018 FINDINGS: The heart size and mediastinal contours are within normal limits. Both lungs are clear. The visualized skeletal structures are unremarkable. IMPRESSION: Normal study. Electronically Signed   By: Charlett Nose M.D.   On: 06/24/2019 19:20    Procedures Procedures (including critical care time)  Medications Ordered in UC Medications - No data to display  Initial Impression / Assessment and Plan / UC Course  I have reviewed the triage vital signs and the nursing notes.  Pertinent labs & imaging results that were available during my care of the patient were reviewed by me and considered in my medical decision making (see chart for details).     Discussed imaging with pt No wheezes heard on exam. Concern pt may have a PE. Encouraged further evaluation in emergency department. Pt understanding and agreeable, declined EMS transport. Pt's husband  accompanying pt, will drive pt to [facility]. AVS provided  Final Clinical Impressions(s) / UC Diagnoses   Final diagnoses:  Cough  Shortness of breath  Tachycardia     Discharge Instructions      Your chest x-ray was normal this evening. It is concerning you are having shortness of breath and an elevated heart rate. If you do have Covid, you are at an increased risk of blood clots.  You have declined EMS transport but it is still advised you drive yourself directly to [facility] for further evaluation and treatment of your shortness of breath.      ED Prescriptions    None     PDMP not reviewed this encounter.   Lurene Shadow, [place] 06/24/19 1958
